# Patient Record
Sex: Female | Born: 1980 | Race: White | Hispanic: No | Marital: Single | Smoking: Current every day smoker
Health system: Southern US, Community
[De-identification: ages and names within clinical notes are randomized; demographics above are authoritative.]

## PROBLEM LIST (undated history)

## (undated) DIAGNOSIS — R7611 Nonspecific reaction to tuberculin skin test without active tuberculosis: Secondary | ICD-10-CM

## (undated) DIAGNOSIS — Z720 Tobacco use: Secondary | ICD-10-CM

## (undated) DIAGNOSIS — F3281 Premenstrual dysphoric disorder: Secondary | ICD-10-CM

## (undated) DIAGNOSIS — S37019A Minor contusion of unspecified kidney, initial encounter: Secondary | ICD-10-CM

## (undated) DIAGNOSIS — I4581 Long QT syndrome: Secondary | ICD-10-CM

## (undated) DIAGNOSIS — F1910 Other psychoactive substance abuse, uncomplicated: Secondary | ICD-10-CM

## (undated) DIAGNOSIS — S2239XA Fracture of one rib, unspecified side, initial encounter for closed fracture: Secondary | ICD-10-CM

## (undated) DIAGNOSIS — S2249XA Multiple fractures of ribs, unspecified side, initial encounter for closed fracture: Secondary | ICD-10-CM

---
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
## 2001-02-27 ENCOUNTER — Emergency Department (HOSPITAL_COMMUNITY): Admission: EM | Admit: 2001-02-27 | Discharge: 2001-02-27 | Payer: Self-pay | Admitting: Internal Medicine

## 2001-04-18 ENCOUNTER — Emergency Department (HOSPITAL_COMMUNITY): Admission: EM | Admit: 2001-04-18 | Discharge: 2001-04-19 | Payer: Self-pay | Admitting: Emergency Medicine

## 2001-12-20 ENCOUNTER — Emergency Department (HOSPITAL_COMMUNITY): Admission: EM | Admit: 2001-12-20 | Discharge: 2001-12-20 | Payer: Self-pay | Admitting: Emergency Medicine

## 2001-12-20 ENCOUNTER — Encounter: Payer: Self-pay | Admitting: Emergency Medicine

## 2002-02-21 ENCOUNTER — Emergency Department (HOSPITAL_COMMUNITY): Admission: EM | Admit: 2002-02-21 | Discharge: 2002-02-21 | Payer: Self-pay | Admitting: Emergency Medicine

## 2002-02-21 ENCOUNTER — Encounter: Payer: Self-pay | Admitting: Emergency Medicine

## 2002-12-05 ENCOUNTER — Emergency Department (HOSPITAL_COMMUNITY): Admission: EM | Admit: 2002-12-05 | Discharge: 2002-12-05 | Payer: Self-pay | Admitting: Emergency Medicine

## 2003-02-14 ENCOUNTER — Emergency Department (HOSPITAL_COMMUNITY): Admission: EM | Admit: 2003-02-14 | Discharge: 2003-02-14 | Payer: Self-pay | Admitting: *Deleted

## 2003-04-01 ENCOUNTER — Emergency Department (HOSPITAL_COMMUNITY): Admission: EM | Admit: 2003-04-01 | Discharge: 2003-04-01 | Payer: Self-pay | Admitting: Emergency Medicine

## 2003-04-01 ENCOUNTER — Encounter: Payer: Self-pay | Admitting: Emergency Medicine

## 2003-04-14 ENCOUNTER — Encounter: Payer: Self-pay | Admitting: Emergency Medicine

## 2003-04-14 ENCOUNTER — Emergency Department (HOSPITAL_COMMUNITY): Admission: EM | Admit: 2003-04-14 | Discharge: 2003-04-14 | Payer: Self-pay | Admitting: Emergency Medicine

## 2003-04-25 ENCOUNTER — Emergency Department (HOSPITAL_COMMUNITY): Admission: AD | Admit: 2003-04-25 | Discharge: 2003-04-25 | Payer: Self-pay | Admitting: Family Medicine

## 2003-08-27 ENCOUNTER — Emergency Department (HOSPITAL_COMMUNITY): Admission: EM | Admit: 2003-08-27 | Discharge: 2003-08-28 | Payer: Self-pay | Admitting: Emergency Medicine

## 2003-11-28 ENCOUNTER — Emergency Department (HOSPITAL_COMMUNITY): Admission: EM | Admit: 2003-11-28 | Discharge: 2003-11-28 | Payer: Self-pay | Admitting: Emergency Medicine

## 2003-11-30 ENCOUNTER — Emergency Department (HOSPITAL_COMMUNITY): Admission: EM | Admit: 2003-11-30 | Discharge: 2003-11-30 | Payer: Self-pay | Admitting: Family Medicine

## 2003-12-20 ENCOUNTER — Emergency Department (HOSPITAL_COMMUNITY): Admission: EM | Admit: 2003-12-20 | Discharge: 2003-12-20 | Payer: Self-pay | Admitting: Emergency Medicine

## 2003-12-27 ENCOUNTER — Emergency Department (HOSPITAL_COMMUNITY): Admission: EM | Admit: 2003-12-27 | Discharge: 2003-12-28 | Payer: Self-pay | Admitting: Emergency Medicine

## 2003-12-27 ENCOUNTER — Emergency Department (HOSPITAL_COMMUNITY): Admission: EM | Admit: 2003-12-27 | Discharge: 2003-12-27 | Payer: Self-pay | Admitting: Emergency Medicine

## 2004-02-11 ENCOUNTER — Emergency Department (HOSPITAL_COMMUNITY): Admission: EM | Admit: 2004-02-11 | Discharge: 2004-02-11 | Payer: Self-pay | Admitting: Emergency Medicine

## 2004-03-15 ENCOUNTER — Emergency Department (HOSPITAL_COMMUNITY): Admission: EM | Admit: 2004-03-15 | Discharge: 2004-03-15 | Payer: Self-pay | Admitting: Family Medicine

## 2004-04-14 ENCOUNTER — Emergency Department (HOSPITAL_COMMUNITY): Admission: EM | Admit: 2004-04-14 | Discharge: 2004-04-14 | Payer: Self-pay

## 2004-07-01 ENCOUNTER — Emergency Department (HOSPITAL_COMMUNITY): Admission: EM | Admit: 2004-07-01 | Discharge: 2004-07-01 | Payer: Self-pay | Admitting: Emergency Medicine

## 2004-08-14 ENCOUNTER — Emergency Department (HOSPITAL_COMMUNITY): Admission: EM | Admit: 2004-08-14 | Discharge: 2004-08-14 | Payer: Self-pay | Admitting: Family Medicine

## 2004-09-29 ENCOUNTER — Emergency Department (HOSPITAL_COMMUNITY): Admission: EM | Admit: 2004-09-29 | Discharge: 2004-09-29 | Payer: Self-pay | Admitting: Family Medicine

## 2004-10-07 ENCOUNTER — Emergency Department (HOSPITAL_COMMUNITY): Admission: EM | Admit: 2004-10-07 | Discharge: 2004-10-07 | Payer: Self-pay | Admitting: Family Medicine

## 2004-10-23 ENCOUNTER — Emergency Department (HOSPITAL_COMMUNITY): Admission: EM | Admit: 2004-10-23 | Discharge: 2004-10-23 | Payer: Self-pay | Admitting: Family Medicine

## 2004-10-23 ENCOUNTER — Emergency Department (HOSPITAL_COMMUNITY): Admission: EM | Admit: 2004-10-23 | Discharge: 2004-10-23 | Payer: Self-pay | Admitting: Emergency Medicine

## 2004-12-10 ENCOUNTER — Emergency Department (HOSPITAL_COMMUNITY): Admission: EM | Admit: 2004-12-10 | Discharge: 2004-12-10 | Payer: Self-pay | Admitting: Family Medicine

## 2004-12-16 ENCOUNTER — Emergency Department (HOSPITAL_COMMUNITY): Admission: EM | Admit: 2004-12-16 | Discharge: 2004-12-16 | Payer: Self-pay | Admitting: Emergency Medicine

## 2004-12-31 ENCOUNTER — Emergency Department (HOSPITAL_COMMUNITY): Admission: EM | Admit: 2004-12-31 | Discharge: 2004-12-31 | Payer: Self-pay | Admitting: Emergency Medicine

## 2005-01-28 ENCOUNTER — Emergency Department (HOSPITAL_COMMUNITY): Admission: EM | Admit: 2005-01-28 | Discharge: 2005-01-28 | Payer: Self-pay | Admitting: Family Medicine

## 2005-02-25 ENCOUNTER — Emergency Department (HOSPITAL_COMMUNITY): Admission: EM | Admit: 2005-02-25 | Discharge: 2005-02-25 | Payer: Self-pay | Admitting: Family Medicine

## 2005-02-26 ENCOUNTER — Emergency Department (HOSPITAL_COMMUNITY): Admission: EM | Admit: 2005-02-26 | Discharge: 2005-02-26 | Payer: Self-pay | Admitting: Family Medicine

## 2005-03-07 ENCOUNTER — Emergency Department (HOSPITAL_COMMUNITY): Admission: EM | Admit: 2005-03-07 | Discharge: 2005-03-07 | Payer: Self-pay | Admitting: Emergency Medicine

## 2005-03-20 ENCOUNTER — Emergency Department (HOSPITAL_COMMUNITY): Admission: EM | Admit: 2005-03-20 | Discharge: 2005-03-20 | Payer: Self-pay | Admitting: Emergency Medicine

## 2005-03-20 ENCOUNTER — Emergency Department (HOSPITAL_COMMUNITY): Admission: EM | Admit: 2005-03-20 | Discharge: 2005-03-20 | Payer: Self-pay | Admitting: Family Medicine

## 2005-05-07 ENCOUNTER — Emergency Department (HOSPITAL_COMMUNITY): Admission: EM | Admit: 2005-05-07 | Discharge: 2005-05-07 | Payer: Self-pay | Admitting: *Deleted

## 2006-02-17 ENCOUNTER — Emergency Department (HOSPITAL_COMMUNITY): Admission: EM | Admit: 2006-02-17 | Discharge: 2006-02-17 | Payer: Self-pay | Admitting: Emergency Medicine

## 2006-02-20 ENCOUNTER — Emergency Department (HOSPITAL_COMMUNITY): Admission: EM | Admit: 2006-02-20 | Discharge: 2006-02-20 | Payer: Self-pay | Admitting: Emergency Medicine

## 2006-04-07 ENCOUNTER — Emergency Department (HOSPITAL_COMMUNITY): Admission: EM | Admit: 2006-04-07 | Discharge: 2006-04-07 | Payer: Self-pay | Admitting: Emergency Medicine

## 2006-04-23 ENCOUNTER — Emergency Department (HOSPITAL_COMMUNITY): Admission: EM | Admit: 2006-04-23 | Discharge: 2006-04-23 | Payer: Self-pay | Admitting: Emergency Medicine

## 2006-04-24 ENCOUNTER — Emergency Department (HOSPITAL_COMMUNITY): Admission: EM | Admit: 2006-04-24 | Discharge: 2006-04-24 | Payer: Self-pay | Admitting: Emergency Medicine

## 2006-07-26 ENCOUNTER — Emergency Department (HOSPITAL_COMMUNITY): Admission: EM | Admit: 2006-07-26 | Discharge: 2006-07-26 | Payer: Self-pay | Admitting: Family Medicine

## 2006-09-15 ENCOUNTER — Emergency Department (HOSPITAL_COMMUNITY): Admission: EM | Admit: 2006-09-15 | Discharge: 2006-09-15 | Payer: Self-pay | Admitting: Emergency Medicine

## 2006-11-08 ENCOUNTER — Emergency Department (HOSPITAL_COMMUNITY): Admission: EM | Admit: 2006-11-08 | Discharge: 2006-11-09 | Payer: Self-pay | Admitting: Emergency Medicine

## 2006-11-09 ENCOUNTER — Ambulatory Visit: Payer: Self-pay | Admitting: Psychiatry

## 2006-11-09 ENCOUNTER — Inpatient Hospital Stay (HOSPITAL_COMMUNITY): Admission: AD | Admit: 2006-11-09 | Discharge: 2006-11-11 | Payer: Self-pay | Admitting: Psychiatry

## 2006-11-11 ENCOUNTER — Emergency Department (HOSPITAL_COMMUNITY): Admission: EM | Admit: 2006-11-11 | Discharge: 2006-11-11 | Payer: Self-pay | Admitting: Emergency Medicine

## 2008-02-13 ENCOUNTER — Emergency Department (HOSPITAL_COMMUNITY): Admission: EM | Admit: 2008-02-13 | Discharge: 2008-02-13 | Payer: Self-pay | Admitting: Family Medicine

## 2008-03-31 ENCOUNTER — Emergency Department (HOSPITAL_COMMUNITY): Admission: EM | Admit: 2008-03-31 | Discharge: 2008-03-31 | Payer: Self-pay | Admitting: Family Medicine

## 2008-04-16 ENCOUNTER — Emergency Department (HOSPITAL_COMMUNITY): Admission: EM | Admit: 2008-04-16 | Discharge: 2008-04-16 | Payer: Self-pay | Admitting: Family Medicine

## 2008-05-15 ENCOUNTER — Emergency Department (HOSPITAL_COMMUNITY): Admission: EM | Admit: 2008-05-15 | Discharge: 2008-05-15 | Payer: Self-pay | Admitting: Emergency Medicine

## 2009-04-02 ENCOUNTER — Emergency Department (HOSPITAL_COMMUNITY): Admission: EM | Admit: 2009-04-02 | Discharge: 2009-04-03 | Payer: Self-pay | Admitting: Emergency Medicine

## 2009-11-20 ENCOUNTER — Emergency Department (HOSPITAL_COMMUNITY): Admission: EM | Admit: 2009-11-20 | Discharge: 2009-11-21 | Payer: Self-pay | Admitting: Emergency Medicine

## 2010-07-18 DIAGNOSIS — R7611 Nonspecific reaction to tuberculin skin test without active tuberculosis: Secondary | ICD-10-CM

## 2010-07-18 HISTORY — DX: Nonspecific reaction to tuberculin skin test without active tuberculosis: R76.11

## 2010-08-08 ENCOUNTER — Emergency Department (HOSPITAL_COMMUNITY)
Admission: EM | Admit: 2010-08-08 | Discharge: 2010-08-08 | Payer: Self-pay | Source: Home / Self Care | Admitting: Emergency Medicine

## 2010-12-03 NOTE — Discharge Summary (Signed)
[name], [name]             ACCOUNT NO.:  [id number]   MEDICAL RECORD NO.:  [id number]          PATIENT TYPE:  IPS   LOCATION:  0501                          FACILITY:  [facility]   PHYSICIAN:  Geoffery Lyons, M.D.      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  Denies suicidal  thoughts.  Getting into withdrawal every time she tries to quit most  with aches sweats, nausea.   PAST PSYCHIATRIC HISTORY:  One of several admissions.  ADHD by history.  A history of prior opiate abuse, admitted to [facility] and  [facility].  A history of suicide attempts.   SECONDARY HISTORY:  As already stated, abuse of opioids.   MEDICAL HISTORY:  Noncontributory.   MEDICATIONS:  None prescribed.   Physical exam performed failed to show any acute findings.   LABORATORY WORK:  CBC:  White blood cells 5.6, hemoglobin 13.3, sodium  132, potassium 3.3, glucose 93, BUN 3, creatinine 0.57, SGOT 20, SGPT  15, TSH 1.375.   Mental status exam revealed a female that initially is sleeping,  arousable, alert,  cooperative.  Speech nonspontaneous.  Minimal  production.  Mood anxious, depressed.  Affect constricted.  Thought  process logical, coherent, and relevant.  No evidence of delusions.  No  hallucinations.  Cognition well-preserved.   DIAGNOSES:  AXIS I:  Opiate dependence.  AXIS II:  No diagnosis.  AXIS III:  No diagnosis.  AXIS IV:  Moderate.  AXIS V:  Upon admission 36; highest global assessment of functioning in  the last year of 70.   COURSE IN THE HOSPITAL:  She was admitted, started having individual and  group psychotherapy.  She  was detoxified with clonidine.  As already  stated, a 30 year old female, single, a student at [employer], endorsed being  dependent on opiates, Vicodin, Percocet, heroin-used to use it IV,  now  snorts.  Went into detox.  Committed to abstinence.  Will graduate next  spring.  In a relationship with her boyfriend who is supportive.  Endorsing malaise, body aches, GI distress.  In regards to that, she was  prescribed for pain day, then they realized they would give her the  energy that she needed so she started abusing it.  On April 25, she  endorsed she was much better.  Able to keep fluids.  Has been able to  eat.  Overall felt that she was  going to be able to make it.  There was  no other acute withdrawal.  She was going to finish the detox and  consider going home the following day, April 26.  She was in full  contact with reality.  Endorsed that the withdrawal was under control.  Mood was improved.  Affect  was brighter.  No suicidal or homicidal  ideas.  No hallucinations.  No delusions.  Endorsed that she was doing  well.  Was going to follow up at the [facility].   DISCHARGE DIAGNOSES:  AXIS I:  Opiate dependence and status post opiate  withdrawal.  AXIS II:  No diagnosis.  AXIS III:  No diagnosis.  AXIS IV:  Moderate.  AXIS V:  Upon discharge, 50.   Discharged on no medications.  To follow up with [facility] at the  [facility].      Geoffery Lyons, M.D.  Electronically Signed     IL/MEDQ  D:  12/07/2006  T:  12/08/2006  Job:  [id number]

## 2011-04-01 ENCOUNTER — Inpatient Hospital Stay (INDEPENDENT_AMBULATORY_CARE_PROVIDER_SITE_OTHER)
Admission: RE | Admit: 2011-04-01 | Discharge: 2011-04-01 | Disposition: A | Discharge status: Home / Self Care | Payer: Self-pay | Source: Ambulatory Visit | Attending: Emergency Medicine | Admitting: Emergency Medicine

## 2011-04-01 DIAGNOSIS — J069 Acute upper respiratory infection, unspecified: Secondary | ICD-10-CM

## 2011-12-08 ENCOUNTER — Emergency Department (INDEPENDENT_AMBULATORY_CARE_PROVIDER_SITE_OTHER)
Admission: EM | Admit: 2011-12-08 | Discharge: 2011-12-08 | Disposition: A | Discharge status: Home / Self Care | Payer: Self-pay | Source: Home / Self Care | Attending: Emergency Medicine | Admitting: Emergency Medicine

## 2011-12-08 ENCOUNTER — Encounter (HOSPITAL_COMMUNITY): Payer: Self-pay | Admitting: Emergency Medicine

## 2011-12-08 DIAGNOSIS — J209 Acute bronchitis, unspecified: Secondary | ICD-10-CM

## 2011-12-08 DIAGNOSIS — J019 Acute sinusitis, unspecified: Secondary | ICD-10-CM

## 2011-12-08 DIAGNOSIS — J069 Acute upper respiratory infection, unspecified: Secondary | ICD-10-CM

## 2011-12-08 HISTORY — DX: Nonspecific reaction to tuberculin skin test without active tuberculosis: R76.11

## 2011-12-08 MED ORDER — BENZONATATE 200 MG PO CAPS: 200.0000 mg | ORAL_CAPSULE | Freq: Three times a day (TID) | ORAL | Status: AC | PRN

## 2011-12-08 MED ORDER — AMOXICILLIN 500 MG PO CAPS: 1000.0000 mg | ORAL_CAPSULE | Freq: Three times a day (TID) | ORAL | Status: AC

## 2011-12-08 NOTE — Discharge Instructions (Signed)

## 2011-12-08 NOTE — ED Notes (Signed)
Onset of symptoms one week ago.  Symptoms include an sore throat initially, then body aches, chest and back pressure, sinus congestion, cough that worsens at night and fatigue and weakness.  Patient has noticed specks of blood on tissue when blowing nose this am.

## 2011-12-08 NOTE — ED Notes (Signed)
[name] CALLED DUE TO MISSING ELECTRONIC RX SENT BY DR.[email or web address] RX WAS RECEIVED.CALLED IN THE OTHER RX [place]ALON

## 2011-12-08 NOTE — ED Provider Notes (Signed)
Chief Complaint  Patient presents with  . URI    History of Present Illness:   [name] is a 31 year old female who's had a one-week history of sore throat, postnasal drip, and hoarseness. She feels achy all over, tired, weak, hot, and cold. She's had a cough productive of green sputum and some chest tightness. 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 She smokes a pack of cigarettes per day, down from 2 packs several months ago. She has some nasal congestion with yellow rhinorrhea and today had an episode of epistaxis. She's also had a headache.  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 Her chest x-ray was normal. She was given INH by the health Department, but has not started it yet. She plans to followup with the health department for her INH followup.  Review of Systems:  Other than noted above, the patient denies any of the following symptoms. Systemic:  No fever, chills, sweats, fatigue, myalgias, headache, or anorexia. Eye:  No redness, pain or drainage. ENT:  No earache, ear congestion, nasal congestion, sneezing, rhinorrhea, sinus pressure, sinus pain, post nasal drip, or sore throat. Lungs:  No cough, sputum production, wheezing, shortness of breath, or chest pain. GI:  No abdominal pain, nausea, vomiting, or diarrhea. Skin:  No rash or itching.  PMFSH:  Past medical history, family history, social history, meds, and allergies were reviewed.  Physical Exam:   Vital signs:  BP 97/63  Pulse 89  Temp(Src) 97.8 F (36.6 C) (Oral)  Resp 18  SpO2 96%  LMP 12/08/2011 General:  Alert, in no distress. Eye:  No conjunctival injection or drainage. Lids were normal. ENT:  TMs and canals were normal, without erythema or inflammation.  Nasal mucosa was clear and uncongested, without drainage.  Mucous membranes were moist.  Pharynx was clear, without exudate or drainage.  There were no oral ulcerations or lesions. Neck:  Supple, no adenopathy, tenderness or  mass. Lungs:  No respiratory distress.  Lungs were clear to auscultation, without wheezes, rales or rhonchi.  Breath sounds were clear and equal bilaterally. Lungs were resonant to percussion.  No egophony. Heart:  Regular rhythm, without gallops, murmers or rubs. Skin:  Clear, warm, and dry, without rash or lesions.  Assessment:  The primary encounter diagnosis was Viral upper respiratory infection. Diagnoses of Acute sinusitis and Acute bronchitis were also pertinent to this visit.  Plan:   1.  The following meds were prescribed:   New Prescriptions   AMOXICILLIN (AMOXIL) 500 MG CAPSULE    Take 2 capsules (1,000 mg total) by mouth 3 (three) times daily.   BENZONATATE (TESSALON) 200 MG CAPSULE    Take 1 capsule (200 mg total) by mouth 3 (three) times daily as needed for cough.   2.  The patient was instructed in symptomatic care and handouts were given. 3.  The patient was told to return if becoming worse in any way, if no better in 3 or 4 days, and given some red flag symptoms that would indicate earlier return. She was encouraged to quit smoking and to take her INH and followup at the health department.   Reuben Likes, MD 12/08/11 1215

## 2011-12-08 NOTE — ED Notes (Signed)
No primary care provider.

## 2012-12-30 ENCOUNTER — Emergency Department (HOSPITAL_COMMUNITY)
Admission: EM | Admit: 2012-12-30 | Discharge: 2012-12-30 | Discharge status: Against Medical Advice | Payer: Self-pay | Attending: Emergency Medicine | Admitting: Emergency Medicine

## 2012-12-30 ENCOUNTER — Emergency Department (HOSPITAL_COMMUNITY): Payer: Self-pay

## 2012-12-30 ENCOUNTER — Encounter (HOSPITAL_COMMUNITY): Payer: Self-pay | Admitting: Emergency Medicine

## 2012-12-30 DIAGNOSIS — R079 Chest pain, unspecified: Secondary | ICD-10-CM

## 2012-12-30 DIAGNOSIS — Z3202 Encounter for pregnancy test, result negative: Secondary | ICD-10-CM | POA: Insufficient documentation

## 2012-12-30 DIAGNOSIS — F17200 Nicotine dependence, unspecified, uncomplicated: Secondary | ICD-10-CM | POA: Insufficient documentation

## 2012-12-30 DIAGNOSIS — R0789 Other chest pain: Secondary | ICD-10-CM | POA: Insufficient documentation

## 2012-12-30 DIAGNOSIS — Z79899 Other long term (current) drug therapy: Secondary | ICD-10-CM | POA: Insufficient documentation

## 2012-12-30 LAB — URINALYSIS W MICROSCOPIC + REFLEX CULTURE
Glucose, UA: NEGATIVE mg/dL
Nitrite: NEGATIVE
Protein, ur: NEGATIVE mg/dL
Specific Gravity, Urine: 1.015 (ref 1.005–1.030)
Urobilinogen, UA: 0.2 mg/dL (ref 0.0–1.0)

## 2012-12-30 LAB — CBC WITH DIFFERENTIAL/PLATELET
Basophils Absolute: 0 10*3/uL (ref 0.0–0.1)
Basophils Relative: 0 % (ref 0–1)
Eosinophils Relative: 1 % (ref 0–5)
HCT: 41.2 % (ref 36.0–46.0)
Lymphocytes Relative: 33 % (ref 12–46)
Lymphs Abs: 2.6 10*3/uL (ref 0.7–4.0)
MCHC: 35.4 g/dL (ref 30.0–36.0)
Monocytes Absolute: 0.7 10*3/uL (ref 0.1–1.0)
Neutro Abs: 4.6 10*3/uL (ref 1.7–7.7)
Neutrophils Relative %: 57 % (ref 43–77)

## 2012-12-30 LAB — COMPREHENSIVE METABOLIC PANEL
ALT: 11 U/L (ref 0–35)
AST: 21 U/L (ref 0–37)
Albumin: 3.9 g/dL (ref 3.5–5.2)
Alkaline Phosphatase: 56 U/L (ref 39–117)
BUN: 8 mg/dL (ref 6–23)
CO2: 27 mEq/L (ref 19–32)
Calcium: 8.7 mg/dL (ref 8.4–10.5)
Creatinine, Ser: 0.65 mg/dL (ref 0.50–1.10)
GFR calc Af Amer: >90 mL/min (ref >90)
Potassium: 4.1 mEq/L (ref 3.5–5.1)
Total Protein: 7.2 g/dL (ref 6.0–8.3)

## 2012-12-30 LAB — RAPID URINE DRUG SCREEN, HOSP PERFORMED: Cocaine: NOT DETECTED

## 2012-12-30 LAB — POCT PREGNANCY, URINE: Preg Test, Ur: NEGATIVE

## 2012-12-30 LAB — POCT I-STAT TROPONIN I: Troponin i, poc: 0.03 ng/mL (ref 0.00–0.08)

## 2012-12-30 NOTE — ED Provider Notes (Signed)
History     CSN: [id number]  Arrival date & time 12/30/12  1209   First MD Initiated Contact with Patient 12/30/12 1231      Chief Complaint  Patient presents with  . Chest Pain    (Consider location/radiation/quality/duration/timing/severity/associated sxs/prior treatment) HPI [name] is a 32 y.o. female who present to ed with chest pain. Stats pain in retrosternal. For 3 wks. Comes and goes. Lasting about an hour at a time. States at times radiating into left arm and left neck. States it is excertional. No pleuritic componint. No cough or URI symptoms. No shortness of breath, nausea, dizziness, diaphoresis. Denies prior cardiac hx. No family hx of heart problems. Denies recent travel or surgeries.   Past Medical History  Diagnosis Date  . 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      History reviewed. No pertinent past surgical history.  No family history on file.  History  Substance Use Topics  . Smoking status: Current Every Day Smoker  . Smokeless tobacco: Not on file  . Alcohol Use: No    OB History   Grav Para Term Preterm Abortions TAB SAB Ect Mult Living                  Review of Systems  Respiratory: Positive for chest tightness. Negative for cough and shortness of breath.   Cardiovascular: Positive for chest pain. Negative for palpitations and leg swelling.  All other systems reviewed and are negative.    Allergies  Sulfa antibiotics  Home Medications   Current Outpatient Rx  Name  Route  Sig  Dispense  Refill  . METHADONE HCL PO   Oral   Take 79 mg by mouth daily.           BP 120/69  Pulse 97  Temp(Src) 98.2 F (36.8 C) (Oral)  Resp 18  Wt 118 lb 8 oz (53.751 kg)  SpO2 98%  LMP 12/16/2012  Physical Exam  Nursing note and vitals reviewed. Constitutional: She is oriented to person, place, and time. She appears well-developed and well-nourished. No distress.  Eyes: Conjunctivae are  normal.  Neck: Neck supple.  Cardiovascular: Normal rate, regular rhythm and normal heart sounds.   Pulmonary/Chest: Effort normal and breath sounds normal. No respiratory distress. She has no wheezes. She has no rales.  Musculoskeletal: She exhibits no edema.  Neurological: She is alert and oriented to person, place, and time.  Skin: Skin is warm and dry.    ED Course  Procedures (including critical care time)  Results for orders placed during the hospital encounter of 12/30/12  CBC WITH DIFFERENTIAL      Result Value Range   WBC 8.0  4.0 - 10.5 K/uL   RBC 4.67  3.87 - 5.11 MIL/uL   Hemoglobin 14.6  12.0 - 15.0 g/dL   HCT 13.0  86.5 - 78.4 %   MCV 88.2  78.0 - 100.0 fL   MCH 31.3  26.0 - 34.0 pg   MCHC 35.4  30.0 - 36.0 g/dL   RDW 69.6  29.5 - 28.4 %   Platelets 217  150 - 400 K/uL   Neutrophils Relative % 57  43 - 77 %   Neutro Abs 4.6  1.7 - 7.7 K/uL   Lymphocytes Relative 33  12 - 46 %   Lymphs Abs 2.6  0.7 - 4.0 K/uL   Monocytes Relative 9  3 - 12 %  Monocytes Absolute 0.7  0.1 - 1.0 K/uL   Eosinophils Relative 1  0 - 5 %   Eosinophils Absolute 0.1  0.0 - 0.7 K/uL   Basophils Relative 0  0 - 1 %   Basophils Absolute 0.0  0.0 - 0.1 K/uL  COMPREHENSIVE METABOLIC PANEL      Result Value Range   Sodium 136  135 - 145 mEq/L   Potassium 4.1  3.5 - 5.1 mEq/L   Chloride 102  96 - 112 mEq/L   CO2 27  19 - 32 mEq/L   Glucose, Bld 86  70 - 99 mg/dL   BUN 8  6 - 23 mg/dL   Creatinine, Ser 1.61  0.50 - 1.10 mg/dL   Calcium 8.7  8.4 - 09.6 mg/dL   Total Protein 7.2  6.0 - 8.3 g/dL   Albumin 3.9  3.5 - 5.2 g/dL   AST 21  0 - 37 U/L   ALT 11  0 - 35 U/L   Alkaline Phosphatase 56  39 - 117 U/L   Total Bilirubin 0.3  0.3 - 1.2 mg/dL   GFR calc non Af Amer >90  >90 mL/min   GFR calc Af Amer >90  >90 mL/min  URINE RAPID DRUG SCREEN (HOSP PERFORMED)      Result Value Range   Opiates POSITIVE (*) NONE DETECTED   Cocaine NONE DETECTED  NONE DETECTED   Benzodiazepines NONE  DETECTED  NONE DETECTED   Amphetamines NONE DETECTED  NONE DETECTED   Tetrahydrocannabinol NONE DETECTED  NONE DETECTED   Barbiturates NONE DETECTED  NONE DETECTED  URINALYSIS W MICROSCOPIC + REFLEX CULTURE      Result Value Range   Color, Urine YELLOW  YELLOW   APPearance CLEAR  CLEAR   Specific Gravity, Urine 1.015  1.005 - 1.030   pH 5.5  5.0 - 8.0   Glucose, UA NEGATIVE  NEGATIVE mg/dL   Hgb urine dipstick NEGATIVE  NEGATIVE   Bilirubin Urine NEGATIVE  NEGATIVE   Ketones, ur NEGATIVE  NEGATIVE mg/dL   Protein, ur NEGATIVE  NEGATIVE mg/dL   Urobilinogen, UA 0.2  0.0 - 1.0 mg/dL   Nitrite NEGATIVE  NEGATIVE   Leukocytes, UA NEGATIVE  NEGATIVE   WBC, UA 0-2  <3 WBC/hpf   RBC / HPF 0-2  <3 RBC/hpf   Bacteria, UA RARE  RARE   Squamous Epithelial / LPF RARE  RARE  POCT PREGNANCY, URINE      Result Value Range   Preg Test, Ur NEGATIVE  NEGATIVE  POCT I-STAT TROPONIN I      Result Value Range   Troponin i, poc 0.03  0.00 - 0.08 ng/mL   Comment 3            Dg Chest 2 View  12/30/2012   *RADIOLOGY REPORT*  Clinical Data: Chest pain  CHEST - 2 VIEW  Comparison: 08/08/2010  Findings: Cardiomediastinal silhouette is stable.  No acute infiltrate or pleural effusion.  No pulmonary edema.  Bony thorax is stable.  IMPRESSION: No active disease.  No significant change.   Original Report Authenticated By: Natasha Mead, M.D.    Date: 12/30/2012  Rate: 88  Rhythm: normal sinus rhythm  QRS Axis: normal  Intervals: normal  ST/T Wave abnormalities: nonspecific T wave changes  Conduction Disutrbances:none  Narrative Interpretation:   Old EKG Reviewed: unchanged when compared to 12/16/04     1. Chest pain       MDM  Pt with intermittent  chest pain for several weeks now. She has no risk factors for ACS, although story she gives is concerning. Pt is PERC negative doubt PE. ECG unchanged from 2006. First set of enzymes negative. Explained that wanted to monitor pt for 3 hrs and recheck 2nd  troponin. Pt did not want to wait. She signed out AMA. Given referral for cardiology. Pt will follow up.   Filed Vitals:   12/30/12 1357 12/30/12 1400 12/30/12 1419 12/30/12 1500  BP:  104/67 104/67 120/69  Pulse: 72 78 68 97  Temp:   98.2 F (36.8 C)   TempSrc:   Oral   Resp: 23 10 14 18   Weight:      SpO2: 100% 99% 97% 98%           Lottie Mussel, PA-C 12/30/12 1655

## 2012-12-30 NOTE — ED Notes (Signed)
PA at bedside.

## 2012-12-30 NOTE — ED Notes (Signed)
Pt. Stated, I'VE BEEN HAVING SOME cp FOR A MONTH THAT'S INTERMITTENT.  i also have some SOB .

## 2012-12-30 NOTE — ED Provider Notes (Signed)
Medical screening examination/treatment/procedure(s) were performed by non-physician practitioner and as supervising physician I was immediately available for consultation/collaboration.   Gwyneth Sprout, MD 12/30/12 2043

## 2013-01-27 ENCOUNTER — Encounter (HOSPITAL_COMMUNITY): Payer: Self-pay | Admitting: *Deleted

## 2013-01-27 ENCOUNTER — Emergency Department (HOSPITAL_COMMUNITY)
Admission: EM | Admit: 2013-01-27 | Discharge: 2013-01-28 | Disposition: A | Discharge status: Home / Self Care | Payer: Self-pay | Attending: Emergency Medicine | Admitting: Emergency Medicine

## 2013-01-27 DIAGNOSIS — F17200 Nicotine dependence, unspecified, uncomplicated: Secondary | ICD-10-CM | POA: Insufficient documentation

## 2013-01-27 DIAGNOSIS — R0602 Shortness of breath: Secondary | ICD-10-CM | POA: Insufficient documentation

## 2013-01-27 DIAGNOSIS — I499 Cardiac arrhythmia, unspecified: Secondary | ICD-10-CM | POA: Insufficient documentation

## 2013-01-27 DIAGNOSIS — R000 Tachycardia, unspecified: Secondary | ICD-10-CM | POA: Insufficient documentation

## 2013-01-27 DIAGNOSIS — Z79899 Other long term (current) drug therapy: Secondary | ICD-10-CM | POA: Insufficient documentation

## 2013-01-27 DIAGNOSIS — R002 Palpitations: Secondary | ICD-10-CM | POA: Insufficient documentation

## 2013-01-27 DIAGNOSIS — R9431 Abnormal electrocardiogram [ECG] [EKG]: Secondary | ICD-10-CM | POA: Insufficient documentation

## 2013-01-27 DIAGNOSIS — F1120 Opioid dependence, uncomplicated: Secondary | ICD-10-CM | POA: Insufficient documentation

## 2013-01-27 DIAGNOSIS — R079 Chest pain, unspecified: Secondary | ICD-10-CM | POA: Insufficient documentation

## 2013-01-27 HISTORY — DX: Other psychoactive substance abuse, uncomplicated: F19.10

## 2013-01-27 HISTORY — DX: Tobacco use: Z72.0

## 2013-01-27 LAB — POCT I-STAT TROPONIN I

## 2013-01-27 NOTE — ED Notes (Addendum)
Pt states she has had the sensation of palpations before and tachycardia. Pt states when it happened before at her dentist she was given lidocaine and the pain went away Pt states that she woke up with CP, pain is center chest and feels like pressure. Pt states that earlier the pain increased with deep breathing but that now it is constant.

## 2013-01-28 ENCOUNTER — Emergency Department (HOSPITAL_COMMUNITY): Payer: Self-pay

## 2013-01-28 ENCOUNTER — Other Ambulatory Visit: Payer: Self-pay

## 2013-01-28 ENCOUNTER — Encounter (HOSPITAL_COMMUNITY): Payer: Self-pay | Admitting: Radiology

## 2013-01-28 DIAGNOSIS — R079 Chest pain, unspecified: Secondary | ICD-10-CM

## 2013-01-28 DIAGNOSIS — R9431 Abnormal electrocardiogram [ECG] [EKG]: Secondary | ICD-10-CM

## 2013-01-28 DIAGNOSIS — R002 Palpitations: Secondary | ICD-10-CM

## 2013-01-28 LAB — CBC WITH DIFFERENTIAL/PLATELET
Basophils Absolute: 0 10*3/uL (ref 0.0–0.1)
Basophils Relative: 0 % (ref 0–1)
Hemoglobin: 15.2 g/dL — ABNORMAL HIGH (ref 12.0–15.0)
Lymphs Abs: 1 10*3/uL (ref 0.7–4.0)
MCHC: 35.4 g/dL (ref 30.0–36.0)
MCV: 88.3 fL (ref 78.0–100.0)
Neutrophils Relative %: 80 % — ABNORMAL HIGH (ref 43–77)
Platelets: 221 10*3/uL (ref 150–400)
RDW: 11.8 % (ref 11.5–15.5)
WBC: 11.2 10*3/uL — ABNORMAL HIGH (ref 4.0–10.5)

## 2013-01-28 LAB — RAPID URINE DRUG SCREEN, HOSP PERFORMED
Cocaine: NOT DETECTED
Opiates: POSITIVE — AB
Tetrahydrocannabinol: NOT DETECTED

## 2013-01-28 LAB — COMPREHENSIVE METABOLIC PANEL
ALT: 12 U/L (ref 0–35)
AST: 24 U/L (ref 0–37)
Alkaline Phosphatase: 64 U/L (ref 39–117)
CO2: 27 mEq/L (ref 19–32)
Calcium: 9.2 mg/dL (ref 8.4–10.5)
Glucose, Bld: 130 mg/dL — ABNORMAL HIGH (ref 70–99)
Total Bilirubin: 0.5 mg/dL (ref 0.3–1.2)
Total Protein: 7.4 g/dL (ref 6.0–8.3)

## 2013-01-28 LAB — D-DIMER, QUANTITATIVE: D-Dimer, Quant: 5.13 ug/mL-FEU — ABNORMAL HIGH (ref 0.00–0.48)

## 2013-01-28 LAB — MAGNESIUM: Magnesium: 1.6 mg/dL (ref 1.5–2.5)

## 2013-01-28 MED ORDER — METHADONE HCL 10 MG PO TABS
80.0000 mg | ORAL_TABLET | ORAL | Status: AC
  Administered 2013-01-28: 80 mg via ORAL

## 2013-01-28 MED ORDER — NICOTINE 21 MG/24HR TD PT24
21.0000 mg | MEDICATED_PATCH | Freq: Once | TRANSDERMAL | Status: DC
  Administered 2013-01-28: 21 mg via TRANSDERMAL
  Filled 2013-01-28: qty 1

## 2013-01-28 MED ORDER — ATENOLOL 25 MG PO TABS: 25.0000 mg | ORAL_TABLET | Freq: Every day | ORAL | Status: DC

## 2013-01-28 MED ORDER — IOHEXOL 350 MG/ML SOLN
100.0000 mL | Freq: Once | INTRAVENOUS | Status: AC | PRN
  Administered 2013-01-28: 100 mL via INTRAVENOUS

## 2013-01-28 NOTE — ED Notes (Signed)
CARDIOLOGY PA IN TO SEE PT. SHE IS WAITING ON DR ROSS TO COME DOWN AND EVAL PT

## 2013-01-28 NOTE — ED Provider Notes (Signed)
History    CSN: [id number] Arrival date & time 01/27/13  2245  First MD Initiated Contact with Patient 01/28/13 0035     Chief Complaint  Patient presents with  . Chest Pain  . Irregular Heart Beat   (Consider location/radiation/quality/duration/timing/severity/associated sxs/prior Treatment) HPI Patient is a generally healthy 32 yo woman who presents after experiencing centrally located chest pain associated with tachycardia (pulse 120s per EMS).  Patient awoke around 2200 tonight with sx and called 911.   She says she has felt SOB with CP. She has been having this intermittent chest pain for the past month or so.  Pt describes pain as pressure like - moderate and severe.  Past Medical History  Diagnosis Date  . 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     History reviewed. No pertinent past surgical history. History reviewed. No pertinent family history. History  Substance Use Topics  . Smoking status: Current Every Day Smoker  . Smokeless tobacco: Not on file  . Alcohol Use: No   OB History   Grav Para Term Preterm Abortions TAB SAB Ect Mult Living                 Review of Systems Gen: no weight loss, fevers, chills, night sweats Eyes: no discharge or drainage, no occular pain or visual changes Nose: no epistaxis or rhinorrhea Mouth: no dental pain, no sore throat Neck: no neck pain Lungs: As per history of present illness, otherwise negative CV: As per history of present illness, otherwise negative Abd: no abdominal pain, nausea, vomiting GU: no dysuria or gross hematuria MSK: no myalgias or arthralgias Neuro: no headache, no focal neurologic deficits Skin: no rash Psyche: negative.  Allergies  Sulfa antibiotics  Home Medications   Current Outpatient Rx  Name  Route  Sig  Dispense  Refill  . METHADONE HCL PO   Oral   Take 79 mg by mouth daily.          BP 108/61  Pulse 118  Resp 16  SpO2 97%  LMP  12/16/2012 Physical Exam Gen: well developed and well nourished appearing, mildly anxious appearing Head: NCAT Eyes: PERL, EOMI Nose: no epistaixis or rhinorrhea Mouth/throat: mucosa is moist and pink Neck: supple, no stridor Lungs: CTA B, no wheezing, rhonchi or rales Heart regular rate and rhythm, pulse 100, extremities well perfused Abd: soft, notender, nondistended Back: no ttp, no cva ttp Skin: no rashese, wnl Neuro: CN ii-xii grossly intact, no focal deficits Psyche; mildly anxious affect,  calm and cooperative.   ED Course  Procedures (including critical care time) Results for orders placed during the hospital encounter of 01/27/13 (from the past 24 hour(s))  CBC WITH DIFFERENTIAL     Status: Abnormal   Collection Time    01/27/13 10:56 PM      Result Value Range   WBC 11.2 (*) 4.0 - 10.5 K/uL   RBC 4.86  3.87 - 5.11 MIL/uL   Hemoglobin 15.2 (*) 12.0 - 15.0 g/dL   HCT 40.9  81.1 - 91.4 %   MCV 88.3  78.0 - 100.0 fL   MCH 31.3  26.0 - 34.0 pg   MCHC 35.4  30.0 - 36.0 g/dL   RDW 78.2  95.6 - 21.3 %   Platelets 221  150 - 400 K/uL   Neutrophils Relative % 80 (*) 43 - 77 %   Neutro Abs 9.0 (*) 1.7 - 7.7 K/uL  Lymphocytes Relative 9 (*) 12 - 46 %   Lymphs Abs 1.0  0.7 - 4.0 K/uL   Monocytes Relative 11  3 - 12 %   Monocytes Absolute 1.2 (*) 0.1 - 1.0 K/uL   Eosinophils Relative 1  0 - 5 %   Eosinophils Absolute 0.1  0.0 - 0.7 K/uL   Basophils Relative 0  0 - 1 %   Basophils Absolute 0.0  0.0 - 0.1 K/uL  COMPREHENSIVE METABOLIC PANEL     Status: Abnormal   Collection Time    01/27/13 10:56 PM      Result Value Range   Sodium 135  135 - 145 mEq/L   Potassium 4.0  3.5 - 5.1 mEq/L   Chloride 99  96 - 112 mEq/L   CO2 27  19 - 32 mEq/L   Glucose, Bld 130 (*) 70 - 99 mg/dL   BUN 8  6 - 23 mg/dL   Creatinine, Ser 1.61  0.50 - 1.10 mg/dL   Calcium 9.2  8.4 - 09.6 mg/dL   Total Protein 7.4  6.0 - 8.3 g/dL   Albumin 3.8  3.5 - 5.2 g/dL   AST 24  0 - 37 U/L   ALT 12  0  - 35 U/L   Alkaline Phosphatase 64  39 - 117 U/L   Total Bilirubin 0.5  0.3 - 1.2 mg/dL   GFR calc non Af Amer >90  >90 mL/min   GFR calc Af Amer >90  >90 mL/min  POCT I-STAT TROPONIN I     Status: None   Collection Time    01/27/13 11:46 PM      Result Value Range   Troponin i, poc 0.02  0.00 - 0.08 ng/mL   Comment 3            CXR: WNL  EKG: sinus tach, 118 bpm, normal axis, no acute st t wave changes, no signs of acute ischemia. Prolonged QT interval noted which is new compared to EKG from < 1 month ago.   MDM  Case discussed with Dr. Donnie Aho at approx 0430. He recommends EP consult at 0730 with TTE.  Case discussed with Dr. Betti Cruz and observation admission requested to complete above work up and monitoring for prolonged Qt in the setting of near syncope. Dr. Betti Cruz declines to admit the patient because he believes it will be most expeditious and cost effective to complete her workup and consultation in the ED.    TTE has been completed. I have made Dr. Ladona Ridgel, EP Cards for [facility], aware of the patient via his OR tech and spoken directly with his PA, Amber whose cell phone number is [phone].  She and Dr. Ladona Ridgel will consult in the ED and give recommendations re: tx and disposition.   Case signed out to Dr. Freida Busman at the change of shift, [phone].   Brandt Loosen, MD 01/28/13 [phone]

## 2013-01-28 NOTE — Progress Notes (Addendum)
Discharge Information for [name] ED Visit Date 01/28/13 Cardiologist: Dr. Lewayne Bunting (Given to patient upon discharge from ER):  - no strenuous activity for now (beyond walking) - avoid QTc-prolonging medications - if you have to take any medication at all, please discuss with a pharmacist or your doctor before taking - call your prescribing doctor of methadone today to discuss a plan to wean off this medication due to risk of QTc prolongation - avoid potassium-lowering medications - start atenolol 25mg  by mouth daily (prescribed electronically) - our office will call you to schedule an exercise stress test and followup appointment - please call the office if you have not heard from [place] within 3 days   [facility] [address] [place], [place] [address] [phone]

## 2013-01-28 NOTE — Consult Note (Signed)
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 Last night after coming inside from taking her dog for a walk, she sat down on the bed and noticed her heart was racing. It felt like it was pounding and was associated with a sense of chest pressure like something was sitting on her chest. The symptoms concerned her so she came right to the ER where she was found to be in sinus tach 117bpm with abnormally long QTc. Symptoms resolved around midnight on their own and she is currently in NSR in the 80's. F/u EKG showed NSR with normal QTc. She has had 2 other instances of tachypalpitations - the first episode occurred 2 months ago spontaneously and another episode happened after getting an injection of epi/lido at the dentist. She also reports frequent chest pain over the past 2 months. It occurs several times a week, sometimes all day long and unremitting. This is described as a pressure. It is sometimes asociated with SOB but no nausea, vomiting, or diaphoresis. It is not necessarily brought on by exertion, but when she is already experiencing it, it tends to worsen with exertion. (I.e. Mopping made it worse the other day when she had already had it for a while. But she was able [place] the [place]park just [place] few days ago and participate in strenous activity without any exertional symptoms.) She does endorse a sensation of food getting stuck at times. She also has had rare episodes of presyncope but not particularly related to any of the above symptoms. She denies any frank syncope. She denies  any recent travel, bedrest, surgery, or LEE. In the ED, troponin neg x 1. WBC 11.2, lytes WNL except glu 130. Urine hcg negative. UDS + opiates, negative otherwise. D-dimer is 5.13. CT angio negative for PE. 2D Echo today showed EF 55-60%, no WMA, no significant valvular disease.  Past Medical History  Diagnosis Date  . 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 ? Needs INH.  Marland Kitchen Polysubstance abuse     a. 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  . Tobacco abuse      Most Recent Cardiac Studies: 2D Echo 01/28/13 Left ventricle: The cavity size was normal. Systolic function was normal. The estimated ejection fraction was in the range of 55% to 60%. Wall motion was normal; there were no regional wall motion abnormalities.    Surgical History: History reviewed. No pertinent past surgical history.   Home Meds: Prior to Admission medications   Medication Sig Start Date End Date Taking? Authorizing Provider  METHADONE HCL PO Take 79 mg by mouth daily.   Yes Historical Provider, MD    Allergies:  Allergies  Allergen Reactions  . Sulfa Antibiotics Nausea Only    History   Social History  . Marital Status: Single    Spouse Name: N/A    Number of Children: N/A  . Years of Education: N/A   Occupational History  . Not on file.   Social History Main Topics  . Smoking status: Current  Every Day Smoker  . Smokeless tobacco: Not on file     Comment: Smoker since age 59  . Alcohol Use: No  . Drug Use: No     Comment: Remote history of opiates, heroin  . Sexually Active: Not on file   Other Topics Concern  . Not on file   Social History Narrative  . No narrative on file     Family History  Problem Relation Age of Onset  . CAD      Grandmother's father  . Other      Mother was worked up remotely for ?parasympathetic nervous system problem but cardiac workup was reportedly unremarkable    Review of  Systems: General: negative for chills, fever, night sweats Cardiovascular: negative for edema, orthopnea, palpitations, paroxysmal nocturnal dyspnea Dermatological: negative for rash Respiratory: negative for cough or wheezing Urologic: negative for hematuria Abdominal: negative for nausea, vomiting, diarrhea, bright red blood per rectum, melena, or hematemesis Neurologic: negative for visual changes, syncope All other systems reviewed and are otherwise negative except as noted above.  Labs:   Lab Results  Component Value Date   WBC 11.2* 01/27/2013   HGB 15.2* 01/27/2013   HCT 42.9 01/27/2013   MCV 88.3 01/27/2013   PLT 221 01/27/2013     Recent Labs Lab 01/27/13 2256  NA 135  K 4.0  CL 99  CO2 27  BUN 8  CREATININE 0.71  CALCIUM 9.2  PROT 7.4  BILITOT 0.5  ALKPHOS 64  ALT 12  AST 24  GLUCOSE 130*    Lab Results  Component Value Date   DDIMER 5.13* 01/28/2013    Radiology/Studies:  Dg Chest 2 View6/15/2014   *RADIOLOGY REPORT*  Clinical Data: Chest pain  CHEST - 2 VIEW  Comparison: 08/08/2010  Findings: Cardiomediastinal silhouette is stable.  No acute infiltrate or pleural effusion.  No pulmonary edema.  Bony thorax is stable.  IMPRESSION: No active disease.  No significant change.   Original Report Authenticated By: Natasha Mead, M.D.   Ct Angio Chest Pe W/cm &/or Wo Cm7/14/2014   *RADIOLOGY REPORT*  Clinical Data: Chest pain, shortness of breath and palpitations. Elevated D-dimer.  CT ANGIOGRAPHY CHEST  Technique:  Multidetector CT imaging of the chest using the standard protocol during bolus administration of intravenous contrast. Multiplanar reconstructed images including MIPs were obtained and reviewed to evaluate the vascular anatomy.  Contrast: OMNIPAQUE IOHEXOL 350 MG/ML SOLN  Comparison: Chest radiograph performed 12/30/2012  Findings: There is no evidence of pulmonary embolus.  The lungs appear clear bilaterally.  A few small lymph nodes are seen along the left  major fissure.  There is no evidence of significant focal consolidation, pleural effusion or pneumothorax. No masses are identified; no abnormal focal contrast enhancement is seen.  The mediastinum is unremarkable in appearance.  No mediastinal lymphadenopathy is seen.  No pericardial effusion is identified. The great vessels are unremarkable in appearance.  No axillary lymphadenopathy is seen.  The visualized portions of the thyroid gland are unremarkable in appearance.  The visualized portions of the liver and spleen are unremarkable.  No acute osseous abnormalities are seen.  IMPRESSION: No evidence of pulmonary embolus; lungs clear bilaterally.   Original Report Authenticated By: Tonia Ghent, M.D.     EKG:  7/13 22:51 sinus tach 117bpm nonspecific ST-T changes, QTc 611 F/u tracing 7/14 NSR 88bpm QTc 459  Physical Exam: Blood pressure 98/56, pulse 66, temperature 98.8 F (37.1 C), temperature source Oral, resp. rate 16, last menstrual  period 01/15/2013, SpO2 97.00%. General: Well developed, well nourished WF in no acute distress. Head: Normocephalic, atraumatic, sclera non-icteric, no xanthomas, nares are without discharge.  Neck: Negative for carotid bruits. JVD not elevated. Lungs: Clear bilaterally to auscultation without wheezes, rales, or rhonchi. Breathing is unlabored. Heart: RRR with S1 S2. No murmurs, rubs, or gallops appreciated. Abdomen: Soft, non-tender, non-distended with normoactive bowel sounds. No hepatomegaly. No rebound/guarding. No obvious abdominal masses. Msk:  Strength and tone appear normal for age. Extremities: No clubbing or cyanosis. No edema.  Distal pedal pulses are 2+ and equal bilaterally. Neuro: Alert and oriented X 3. No focal deficit. No facial asymmetry. Moves all extremities spontaneously. Psych:  Responds to questions appropriately with a normal affect.    ASSESSMENT AND PLAN:  1. Palpitations in the setting of sinus tach with transient QTc  prolongation 2. Chest pain, mostly atypical, with a component of dysphagia as well 3. Tobacco abuse 4. History of polysubstance abuse (opiates/heroin - now clean), maintained on chronic methadone 5. H/o positive PPD, pt did not yet go for INH - instructed to f/u health dept for further recommendations  The patient was seen and examined with Dr. Ladona Ridgel. Please see below for this addendum. Per his recommendations: - no strenuous activity for now beyond walking - avoid QTc prolonging medications - she needs to call prescribing doctor of methadone to discuss weaning off - avoid potassium-lowering medications - start atenolol 25mg  daily (rx'd electronically) - exercise treadmill stress test and followup at [facility] - office scheduling line was busy, so I left message for our office to call patient and schedule these appointments (office will call her - she was instructed to contact our office if she has not heard from them within 3 days)  Signed, Ronie Spies PA-C 01/28/2013, 12:40 PM  EP Consultation  Patient seen and examined. I have discussed the case with Ronie Spies, PA-C and amended her note as needed. The patient has long QT which has not been previously diagnosed but has become manifest while on Methadone which is known to cause QT prolongation and I suspect has unmasked her ECG. I have helped formulate the plan as above. She needs to wean off of methadone, avoid QT prolonging drugs and take a beta blocker. She does have chest pain but is low risk and I would recommend a regular exercise test which will also be helpful to evaluate QT during exercise to assess risk. She has never had syncope. Her current palpitations appear to be due to sinus tachycardia and not another arrhythmia.  Leonia Reeves.D.

## 2013-01-28 NOTE — ED Notes (Signed)
REPAGED [place] CARD MASTER TO CHECK ON DELAY IN SEEING PATIENT

## 2013-01-28 NOTE — [facility] Counselor (Signed)
Pt is a cardiac pt, not an ACT pt, ED note is inaccurate. Pt does not have "anxiety". Denice Bors, AADC 01/28/2013 4:30 PM

## 2013-01-28 NOTE — Progress Notes (Signed)
  Echocardiogram 2D Echocardiogram has been performed.  [name] 01/28/2013, 8:01 AM

## 2013-01-28 NOTE — ED Notes (Signed)
[facility] CARDS PA HAS GIVEN PT RX AND INSTRUCTIONS. SHE IS TO FOLLOW UP WITH THEM IN A FEW DAYS. AWAITING EDP TO DISPO

## 2013-02-04 ENCOUNTER — Telehealth: Payer: Self-pay | Admitting: Internal Medicine

## 2013-02-04 NOTE — Telephone Encounter (Addendum)
Pt was to be seen this week for ETT, she's down 02-07-13 with Nehemiah Settle an due to a death in her family she will not be here, the other PA's are booked, taylor on vacation, next thing with brooke 7-31, is it ok to move her out a week? Has cxl spot open 02-05-13 , pt coming then/mt

## 2013-02-05 ENCOUNTER — Encounter: Payer: Self-pay | Admitting: Nurse Practitioner

## 2013-02-05 ENCOUNTER — Encounter: Payer: Self-pay | Admitting: Internal Medicine

## 2013-02-05 ENCOUNTER — Ambulatory Visit (INDEPENDENT_AMBULATORY_CARE_PROVIDER_SITE_OTHER): Payer: Self-pay | Admitting: Nurse Practitioner

## 2013-02-05 ENCOUNTER — Encounter (INDEPENDENT_AMBULATORY_CARE_PROVIDER_SITE_OTHER): Payer: Self-pay

## 2013-02-05 ENCOUNTER — Encounter: Payer: Self-pay | Admitting: *Deleted

## 2013-02-05 VITALS — BP 103/67 | HR 73 | Resp 20

## 2013-02-05 DIAGNOSIS — R002 Palpitations: Secondary | ICD-10-CM

## 2013-02-05 DIAGNOSIS — R079 Chest pain, unspecified: Secondary | ICD-10-CM

## 2013-02-05 DIAGNOSIS — R42 Dizziness and giddiness: Secondary | ICD-10-CM

## 2013-02-05 DIAGNOSIS — R9431 Abnormal electrocardiogram [ECG] [EKG]: Secondary | ICD-10-CM

## 2013-02-05 NOTE — Patient Instructions (Addendum)
We are going to place a 24 hour Holter  We will get you a visit with Dr.Taylor  Minimize your caffeine and liberalize your salt use

## 2013-02-05 NOTE — Progress Notes (Signed)
Patient ID: [name], female   DOB: 04/20/1981, 32 y.o.   MRN: [id number] E-cardio 24 hour holter monitor applied to patient.

## 2013-02-05 NOTE — Progress Notes (Addendum)
Exercise Treadmill Test  Pre-Exercise Testing Evaluation Rhythm: normal sinus  Rate: 72     Test  Exercise Tolerance Test Ordering MD: Lewayne Bunting, MD  Interpreting MD: Norma Fredrickson, NP  Unique Test No: 1  Treadmill:  1  Indication for ETT: chest pain - rule out ischemia  Contraindication to ETT: No   Stress Modality: exercise - treadmill  Cardiac Imaging Performed: non   Protocol: standard Bruce - maximal  Max BP:  131/60  Max MPHR (bpm):  190 85% MPR (bpm):  162  MPHR obtained (bpm):  122 % MPHR obtained:  64%  Reached 85% MPHR (min:sec):  NA Total Exercise Time (min-sec):  8:45  Workload in METS:  10 Borg Scale: 16  Reason ETT Terminated:  patient's desire to stop    ST Segment Analysis At Rest: normal ST segments - no evidence of significant ST depression With Exercise: no evidence of significant ST depression  Other Information Arrhythmia:  No Angina during ETT:  absent (0) Quality of ETT:  non-diagnostic  ETT Interpretation:  normal - no evidence of ischemia by ST analysis  Comments: Patient presents today for routine GXT. Has had tachycardia and chest pain with a recent ER visit. Noted to have prolonged QT on EKG. Also on Methadone. Has had prior drug use and admits to a relapse 3 months ago (heroin). Was placed on atenolol and needs to have methadone weaned.  She has continued to have chest pain and palpitations. Numerous presyncopal spells. BP normally low.    Today, she exercised on the standard Bruce protocol for 8:45. Fair exercise tolerance. Test stopped due to fatigue and dyspnea. No chest pain. EKG negative for significant ST depression. Some nonspecific changes noted. No significant arrhythmia. One PAC and 2 PVCs noted during exercise. Adequate BP response. Resting EKG does not show any QT prolongation.   Recommendations: 24 hour Holter Liberalize salt to help keep her BP up.  Follow up with Dr. Ladona Ridgel as planned in early August.  Continue atenolol Wean  Methadone per the prescribing MD (this has been started). No drug use.  Patient is agreeable to this plan and will call if any problems develop in the interim.   Rosalio Macadamia, RN, ANP-C [facility] [address] [place], [place]  [address]

## 2013-02-07 ENCOUNTER — Emergency Department (HOSPITAL_COMMUNITY): Payer: Self-pay

## 2013-02-07 ENCOUNTER — Encounter: Payer: Self-pay | Admitting: Cardiology

## 2013-02-07 ENCOUNTER — Emergency Department (HOSPITAL_COMMUNITY)
Admission: EM | Admit: 2013-02-07 | Discharge: 2013-02-07 | Disposition: A | Discharge status: Home / Self Care | Payer: Self-pay | Attending: Emergency Medicine | Admitting: Emergency Medicine

## 2013-02-07 ENCOUNTER — Encounter (HOSPITAL_COMMUNITY): Payer: Self-pay | Admitting: Family Medicine

## 2013-02-07 DIAGNOSIS — F17200 Nicotine dependence, unspecified, uncomplicated: Secondary | ICD-10-CM | POA: Insufficient documentation

## 2013-02-07 DIAGNOSIS — R002 Palpitations: Secondary | ICD-10-CM | POA: Insufficient documentation

## 2013-02-07 DIAGNOSIS — R0789 Other chest pain: Secondary | ICD-10-CM | POA: Insufficient documentation

## 2013-02-07 DIAGNOSIS — R42 Dizziness and giddiness: Secondary | ICD-10-CM | POA: Insufficient documentation

## 2013-02-07 DIAGNOSIS — Z79899 Other long term (current) drug therapy: Secondary | ICD-10-CM | POA: Insufficient documentation

## 2013-02-07 DIAGNOSIS — R0602 Shortness of breath: Secondary | ICD-10-CM | POA: Insufficient documentation

## 2013-02-07 LAB — BASIC METABOLIC PANEL
BUN: 10 mg/dL (ref 6–23)
CO2: 29 mEq/L (ref 19–32)
Chloride: 102 mEq/L (ref 96–112)
Creatinine, Ser: 0.76 mg/dL (ref 0.50–1.10)
Glucose, Bld: 102 mg/dL — ABNORMAL HIGH (ref 70–99)

## 2013-02-07 LAB — POCT I-STAT TROPONIN I: Troponin i, poc: 0.04 ng/mL (ref 0.00–0.08)

## 2013-02-07 LAB — CBC
HCT: 41.8 % (ref 36.0–46.0)
MCV: 89.9 fL (ref 78.0–100.0)
RBC: 4.65 MIL/uL (ref 3.87–5.11)
WBC: 6.1 10*3/uL (ref 4.0–10.5)

## 2013-02-07 MED ORDER — PROMETHAZINE HCL 12.5 MG PO TABS
12.5000 mg | ORAL_TABLET | Freq: Once | ORAL | Status: AC
  Administered 2013-02-07: 12.5 mg via ORAL
  Filled 2013-02-07: qty 1

## 2013-02-07 MED ORDER — PROMETHAZINE HCL 25 MG PO TABS: 12.5000 mg | ORAL_TABLET | Freq: Four times a day (QID) | ORAL | Status: DC | PRN

## 2013-02-07 NOTE — ED Provider Notes (Signed)
CSN: [id number]     Arrival date & time 02/07/13  1647 History     First MD Initiated Contact with Patient 02/07/13 1734     Chief Complaint  Patient presents with  . Palpitations  . Chest Pain   (Consider location/radiation/quality/duration/timing/severity/associated sxs/prior Treatment) HPI  [name] is a 32 y.o.female with a significant PMH of tachycardia, chest pain and prolonged QT presents to the ER with complaints of chest pain, palpitations, being tired and shortness of breath onset 1 hour before arrival. She took a dose of her beta blocker and all of the symptoms resolved. Pt has been having episodes like these for a while now and was admitted 10 days ago and had cardiac echo, stress test and has worn a halter monitor. Is being managed by Dr. Sharrell Ku (cardiology). Echo and stress test were unremarkable. Pt is on methadone which prolongs QT interval and she reports that she has been titrating down on this medication but has been feeling more tired then normal since doing this. She is completely asymptomatic and came to the ER because the episode or tachycardia/palpations and pain scared here.  Past Medical History  Diagnosis Date  . 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 ? Needs INH.  Marland Kitchen Polysubstance abuse     a. 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  . Tobacco abuse    History reviewed. No pertinent past surgical history. Family History  Problem Relation Age of Onset  . CAD      Grandmother's father  . Other      Mother was worked up remotely for ?parasympathetic nervous system problem but cardiac workup was reportedly unremarkable   History  Substance Use Topics  . Smoking status: Current Every Day Smoker  . Smokeless tobacco: Not on file     Comment: Smoker since age 89  . Alcohol Use: No   OB History   Grav Para Term Preterm Abortions TAB SAB Ect Mult Living                 Review  of Systems  Respiratory: Positive for shortness of breath.   Cardiovascular: Positive for chest pain and palpitations.  Neurological: Positive for dizziness.  All other systems reviewed and are negative.    Allergies  Sulfa antibiotics  Home Medications   Current Outpatient Rx  Name  Route  Sig  Dispense  Refill  . atenolol (TENORMIN) 25 MG tablet   Oral   Take 1 tablet (25 mg total) by mouth daily.   30 tablet   2   . METHADONE HCL PO   Oral   Take 79 mg by mouth daily.         . promethazine (PHENERGAN) 25 MG tablet   Oral   Take 0.5 tablets (12.5 mg total) by mouth every 6 (six) hours as needed for nausea.   20 tablet   0    BP 96/52  Pulse 49  Temp(Src) 99 F (37.2 C) (Oral)  Resp 20  SpO2 99%  LMP 01/15/2013 Physical Exam  Nursing note and vitals reviewed. Constitutional: She appears well-developed and well-nourished. No distress.  HENT:  Head: Normocephalic and atraumatic.  Eyes: Pupils are equal, round, and reactive to light.  Neck: Normal range of motion. Neck supple.  Cardiovascular: Normal rate and regular rhythm.   No murmur heard. Pulmonary/Chest: Effort normal. She exhibits no tenderness, no swelling and no retraction.  Abdominal: Soft.  Neurological: She is alert.  Skin: Skin is warm and dry.    ED Course   Procedures (including critical care time)  Labs Reviewed  BASIC METABOLIC PANEL - Abnormal; Notable for the following:    Glucose, Bld 102 (*)    All other components within normal limits  CBC  POCT I-STAT TROPONIN I   Dg Chest 2 View  02/07/2013   *RADIOLOGY REPORT*  Clinical Data: Cardiac palpitations and chest pain  CHEST - 2 VIEW  Comparison:  December 30, 2012  Findings:  Lungs clear.  The heart size and pulmonary vascularity are normal.  No adenopathy.  No pneumothorax.  No bone lesions.  IMPRESSION: No abnormality noted.   Original Report Authenticated By: Bretta Bang, M.D.   1. Palpitations     MDM   Date:  02/07/2013  Rate: 51  Rhythm: sinus brady  QRS Axis: normal  Intervals: normal  ST/T Wave abnormalities: normal  Conduction Disutrbances:none  Narrative Interpretation:   Old EKG Reviewed: improved from 01/28/2013    Patients asymptomatic in ED. Currently being worked up for this problem by [employer], no acute changes. I have advised them to call [facility] tomorrow and let them know about visit to ED. Pt feeling nauseous while coming off of Methadone, Rx Phenergan 12.5 mg to help.  31 y.o.[name]'s evaluation in the Emergency Department is complete. It has been determined that no acute conditions requiring further emergency intervention are present at this time. The patient/guardian have been advised of the diagnosis and plan. We have discussed signs and symptoms that warrant return to the ED, such as changes or worsening in symptoms.  Vital signs are stable at discharge. Filed Vitals:   02/07/13 1820  BP: 96/52  Pulse:   Temp:   Resp: 20    Patient/guardian has voiced understanding and agreed to follow-up with the PCP or specialist.      Dorthula Matas, PA-C 02/07/13 1901

## 2013-02-07 NOTE — ED Provider Notes (Signed)
Medical screening examination/treatment/procedure(s) were performed by non-physician practitioner and as supervising physician I was immediately available for consultation/collaboration.   Charles B. Bernette Mayers, MD 02/07/13 1904

## 2013-02-07 NOTE — ED Notes (Signed)
Per pt sts palpitations and chest pain. sts she took a beta blocker about 30 min ago and palpations are better but still having chest pain. Recently dx with prolonged QT. Denies any other symptoms.

## 2013-02-07 NOTE — ED Notes (Signed)
Pt alert and oriented, with steady gait at time of discharge. Pt given discharge papers and papers explained. All questions answered and pt walked to discharge.  

## 2013-02-07 NOTE — ED Notes (Signed)
Xray made aware the patient is in the room at this time.

## 2013-02-13 ENCOUNTER — Telehealth: Payer: Self-pay | Admitting: Internal Medicine

## 2013-02-13 NOTE — Telephone Encounter (Signed)
Spoke with patient and let her know she should follow up with her PCP.  She has no PCP but I explained to her the importance of getting one.  She has a follow up on 02/15/13 with Dr Ladona Ridgel and I have advised her to keep this to discuss her plan of care with him

## 2013-02-13 NOTE — Telephone Encounter (Signed)
New prob  Pt states she fell down and her body was convulsing last night.  She said she did not lose consciousness.  She would like to speak with a nurse.

## 2013-02-13 NOTE — Telephone Encounter (Signed)
Follow up with PCP

## 2013-02-15 ENCOUNTER — Ambulatory Visit (INDEPENDENT_AMBULATORY_CARE_PROVIDER_SITE_OTHER): Payer: Self-pay | Admitting: Internal Medicine

## 2013-02-15 ENCOUNTER — Encounter: Payer: Self-pay | Admitting: Internal Medicine

## 2013-02-15 VITALS — BP 103/56 | HR 58 | Ht 59.0 in | Wt 119.0 lb

## 2013-02-15 DIAGNOSIS — R9431 Abnormal electrocardiogram [ECG] [EKG]: Secondary | ICD-10-CM

## 2013-02-15 MED ORDER — ATENOLOL 25 MG PO TABS: 25.0000 mg | ORAL_TABLET | ORAL | Status: DC | PRN

## 2013-02-15 NOTE — Assessment & Plan Note (Signed)
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 I have strongly encouraged the patient to reduce her dose of methadone and wean off as soon as possible. She is instructed to increase her atenolol to 25 mg twice or up to 3 or 4 times daily as needed to reduce rebound at adrenergic drive. I suspect her wrisk of sudden cardiac death is low as she has been on high-dose methadone for 6 years without symptoms. I have encouraged the patient to avoid any additional QT prolonging drugs. If she has palpitations, or chest pain, she is instructed to increase her dose of atenolol. If she has frank syncope, she is instructed to return to the emergency department.

## 2013-02-15 NOTE — Progress Notes (Signed)
HPI [name] returns for evaluation of prolongation of the QT interval and chest pain. The patient is a polysubstance abuser, who is on chronic methadone therapy. She presents to the emergency room several weeks ago with chest pain, palpitations, and was found to have a QT interval of over 600 ms. At that time, I recommended weaning of the methadone. The patient has had heroin relapses in the last several months. She also has intermittent episodes of chest pain associated with palpitations. In addition, she at times has trouble swallowing. There is no relationship to exertion with her chest discomfort. She has not had frank syncope. When she was initially seen, atenolol was recommended. She is quite anxious, accompanied today her fianc. Allergies  Allergen Reactions  . Sulfa Antibiotics Nausea Only     Current Outpatient Prescriptions  Medication Sig Dispense Refill  . atenolol (TENORMIN) 25 MG tablet Take 1 tablet (25 mg total) by mouth as needed.  60 tablet  0  . METHADONE HCL PO Take 68 mg by mouth daily.        No current facility-administered medications for this visit.     Past Medical History  Diagnosis Date  . 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 ? Needs INH.  Marland Kitchen Polysubstance abuse     a. 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  . Tobacco abuse     ROS:   All systems reviewed and negative except as noted in the HPI.   History reviewed. No pertinent past surgical history.   Family History  Problem Relation Age of Onset  . CAD      Grandmother's father  . Other      Mother was worked up remotely for ?parasympathetic nervous system problem but cardiac workup was reportedly unremarkable     History   Social History  . Marital Status: Single    Spouse Name: N/A    Number of Children: N/A  . Years of Education: N/A   Occupational History  . Not on file.   Social History Main Topics  .  Smoking status: Current Every Day Smoker  . Smokeless tobacco: Not on file     Comment: Smoker since age 43  . Alcohol Use: No  . Drug Use: No     Comment: Remote history of opiates, heroin  . Sexually Active: Not on file   Other Topics Concern  . Not on file   Social History Narrative  . No narrative on file     BP 103/56  Pulse 58  Ht 4\' 11"  (1.499 m)  Wt 119 lb (53.978 kg)  BMI 24.02 kg/m2  LMP 01/15/2013  Physical Exam:  stable appearing NAD HEENT: Unremarkable Neck:  No JVD, no thyromegally Back:  No CVA tenderness Lungs:  Clear with no wheezes, rales, or rhonchi. HEART:  Regular rate rhythm, no murmurs, no rubs, no clicks Abd:  soft, positive bowel sounds, no organomegally, no rebound, no guarding Ext:  2 plus pulses, no edema, no cyanosis, no clubbing Skin:  No rashes no nodules, healed skin tracks appeared to present on the upper extremities. Neuro:  CN II through XII intact, motor grossly intact  EKG - sinus rhythm with borderline QT prolongation. The T waves are of small magnitude.   Assess/Plan:

## 2013-02-15 NOTE — Patient Instructions (Signed)
Your physician wants you to follow-up in: 6 months with Dr Court Joy will receive a reminder letter in the mail two months in advance. If you don't receive a letter, please call our office to schedule the follow-up appointment.  Your physician has recommended you make the following change in your medication:  1) Stasrt Atenolol 25mg  as needed

## 2013-02-20 ENCOUNTER — Telehealth: Payer: Self-pay

## 2013-02-20 NOTE — Telephone Encounter (Signed)
[facility] on [address] faxed her Rx for atenolol 25mg  (prn) back stating that pt has been taking one tablet every day. I called pharm & they told me the last refill she picked up on 8/2 said "take one tablet daily". I called pt to tell her her Rx should read take one tab AS NEEDED. I called her at home at 10:20am & it said " The person you have called is unavailable right now please try again later". I then tried her cell & that said "The person you are trying to reach is not accepting calls at this time please try again later". I called both numbers again at 2:00pm & 3:30pm & got the same messages.

## 2013-02-27 ENCOUNTER — Telehealth: Payer: Self-pay

## 2013-02-27 NOTE — Telephone Encounter (Signed)
Called pt to confirm she is only taking Tenormin prn, NOT once a day. She is taking it as needed. Pt wanted to schedule an appt. with Dr. Ladona Ridgel soon because of recent issues she is having. I am at [facility] today so I asked her to call the [facility] office to make that appt. Pt agreed.

## 2013-03-28 ENCOUNTER — Other Ambulatory Visit: Payer: Self-pay | Admitting: Internal Medicine

## 2013-08-21 ENCOUNTER — Encounter: Payer: Self-pay | Admitting: Internal Medicine

## 2013-08-21 ENCOUNTER — Ambulatory Visit: Payer: Self-pay | Admitting: Internal Medicine

## 2013-08-21 ENCOUNTER — Ambulatory Visit (INDEPENDENT_AMBULATORY_CARE_PROVIDER_SITE_OTHER): Payer: Self-pay | Admitting: Internal Medicine

## 2013-08-21 VITALS — BP 106/61 | HR 53 | Ht 59.0 in | Wt 119.0 lb

## 2013-08-21 DIAGNOSIS — R9431 Abnormal electrocardiogram [ECG] [EKG]: Secondary | ICD-10-CM

## 2013-08-21 DIAGNOSIS — R002 Palpitations: Secondary | ICD-10-CM | POA: Insufficient documentation

## 2013-08-21 MED ORDER — ATENOLOL 25 MG PO TABS: 25.0000 mg | ORAL_TABLET | Freq: Every day | ORAL | Status: DC

## 2013-08-21 NOTE — Assessment & Plan Note (Signed)
I suspect this is due to her caffiene use. She drinks a 24 oz coffee a day. I have asked her to reduce herself to half and half caffeine.

## 2013-08-21 NOTE — Progress Notes (Signed)
HPI [name] returns for evaluation of prolongation of the QT interval and chest pain. The patient is a polysubstance abuser, who is on chronic methadone therapy. She presents to the emergency room several months ago with chest pain, palpitations, and was found to have a QT interval of over 600 ms. At that time, I recommended weaning of the methadone. The patient has had heroin relapses in the past. She also has intermittent episodes of chest pain associated with palpitations. In addition, she notes some dizziness with standing. Allergies  Allergen Reactions  . Sulfa Antibiotics Nausea Only     Current Outpatient Prescriptions  Medication Sig Dispense Refill  . atenolol (TENORMIN) 25 MG tablet TAKE ONE TABLET BY MOUTH  TWICE DAILY      . METHADONE HCL PO Take 42 mg by mouth daily.        No current facility-administered medications for this visit.     Past Medical History  Diagnosis Date  . 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 ? Needs INH.  Marland Kitchen. Polysubstance abuse     a. 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  . Tobacco abuse     ROS:   All systems reviewed and negative except as noted in the HPI.   History reviewed. No pertinent past surgical history.   Family History  Problem Relation Age of Onset  . CAD      Grandmother's father  . Other      Mother was worked up remotely for ?parasympathetic nervous system problem but cardiac workup was reportedly unremarkable     History   Social History  . Marital Status: Single    Spouse Name: N/A    Number of Children: N/A  . Years of Education: N/A   Occupational History  . Not on file.   Social History Main Topics  . Smoking status: Current Every Day Smoker  . Smokeless tobacco: Not on file     Comment: Smoker since age 33  . Alcohol Use: No  . Drug Use: No     Comment: Remote history of opiates, heroin  . Sexual Activity: Not on file    Other Topics Concern  . Not on file   Social History Narrative  . No narrative on file     BP 106/61  Pulse 53  Ht 4\' 11"  (1.499 m)  Wt 119 lb (53.978 kg)  BMI 24.02 kg/m2  Physical Exam:  stable appearing NAD HEENT: Unremarkable Neck:  No JVD, no thyromegally Back:  No CVA tenderness Lungs:  Clear with no wheezes, rales, or rhonchi. HEART:  Regular rate rhythm, no murmurs, no rubs, no clicks Abd:  soft, positive bowel sounds, no organomegally, no rebound, no guarding Ext:  2 plus pulses, no edema, no cyanosis, no clubbing Skin:  No rashes no nodules, healed skin tracks appeared to present on the upper extremities. Neuro:  CN II through XII intact, motor grossly intact  EKG - sinus rhythm with no QT prolongation. The T waves are of small magnitude.   Assess/Plan:

## 2013-08-21 NOTE — Assessment & Plan Note (Signed)
Her methadone dose has been reduced and her QT interval is improved on her beta blocker. She does have some fatigue and dizziness and I have asked her to reduce her dose of atenolol. I will defer additional reductions in methadone to her counselor.

## 2013-08-21 NOTE — Patient Instructions (Signed)
Your physician has recommended you make the following change in your medication:  1) Decrease Atenolol to 25 mg once daily  Decrease your caffeine intake in half  Your physician wants you to follow-up in: 6 months with Dr. Ladona Ridgelaylor.  You will receive a reminder letter in the mail two months in advance. If you don't receive a letter, please call our office to schedule the follow-up appointment.

## 2014-01-27 ENCOUNTER — Encounter (HOSPITAL_COMMUNITY): Payer: Self-pay | Admitting: Emergency Medicine

## 2014-01-27 ENCOUNTER — Emergency Department (HOSPITAL_COMMUNITY)
Admission: EM | Admit: 2014-01-27 | Discharge: 2014-01-27 | Discharge status: Against Medical Advice | Payer: Self-pay | Attending: Emergency Medicine | Admitting: Emergency Medicine

## 2014-01-27 DIAGNOSIS — R143 Flatulence: Secondary | ICD-10-CM

## 2014-01-27 DIAGNOSIS — Z3202 Encounter for pregnancy test, result negative: Secondary | ICD-10-CM | POA: Insufficient documentation

## 2014-01-27 DIAGNOSIS — R141 Gas pain: Secondary | ICD-10-CM | POA: Insufficient documentation

## 2014-01-27 DIAGNOSIS — F17200 Nicotine dependence, unspecified, uncomplicated: Secondary | ICD-10-CM | POA: Insufficient documentation

## 2014-01-27 DIAGNOSIS — R142 Eructation: Secondary | ICD-10-CM

## 2014-01-27 DIAGNOSIS — R109 Unspecified abdominal pain: Secondary | ICD-10-CM | POA: Insufficient documentation

## 2014-01-27 DIAGNOSIS — Z79899 Other long term (current) drug therapy: Secondary | ICD-10-CM | POA: Insufficient documentation

## 2014-01-27 HISTORY — DX: Long QT syndrome: I45.81

## 2014-01-27 LAB — CBC WITH DIFFERENTIAL/PLATELET
Basophils Absolute: 0 10*3/uL (ref 0.0–0.1)
Basophils Relative: 0 % (ref 0–1)
EOS ABS: 0.2 10*3/uL (ref 0.0–0.7)
EOS PCT: 3 % (ref 0–5)
HCT: 44 % (ref 36.0–46.0)
Hemoglobin: 15.5 g/dL — ABNORMAL HIGH (ref 12.0–15.0)
LYMPHS ABS: 2.4 10*3/uL (ref 0.7–4.0)
Lymphocytes Relative: 39 % (ref 12–46)
MCH: 32.4 pg (ref 26.0–34.0)
MCHC: 35.2 g/dL (ref 30.0–36.0)
MCV: 91.9 fL (ref 78.0–100.0)
MONO ABS: 0.8 10*3/uL (ref 0.1–1.0)
MONOS PCT: 13 % — AB (ref 3–12)
Neutro Abs: 2.7 10*3/uL (ref 1.7–7.7)
Neutrophils Relative %: 45 % (ref 43–77)
PLATELETS: 233 10*3/uL (ref 150–400)
RBC: 4.79 MIL/uL (ref 3.87–5.11)
RDW: 11.7 % (ref 11.5–15.5)
WBC: 6 10*3/uL (ref 4.0–10.5)

## 2014-01-27 LAB — POC URINE PREG, ED: PREG TEST UR: NEGATIVE

## 2014-01-27 LAB — URINALYSIS, ROUTINE W REFLEX MICROSCOPIC
BILIRUBIN URINE: NEGATIVE
Glucose, UA: NEGATIVE mg/dL
Hgb urine dipstick: NEGATIVE
KETONES UR: 15 mg/dL — AB
LEUKOCYTES UA: NEGATIVE
NITRITE: NEGATIVE
PROTEIN: NEGATIVE mg/dL
Specific Gravity, Urine: 1.021 (ref 1.005–1.030)
Urobilinogen, UA: 1 mg/dL (ref 0.0–1.0)
pH: 5 (ref 5.0–8.0)

## 2014-01-27 LAB — COMPREHENSIVE METABOLIC PANEL
ALT: 9 U/L (ref 0–35)
ANION GAP: 11 (ref 5–15)
AST: 14 U/L (ref 0–37)
Albumin: 3.9 g/dL (ref 3.5–5.2)
Alkaline Phosphatase: 49 U/L (ref 39–117)
BUN: 9 mg/dL (ref 6–23)
CALCIUM: 9 mg/dL (ref 8.4–10.5)
CO2: 27 mEq/L (ref 19–32)
CREATININE: 0.62 mg/dL (ref 0.50–1.10)
Chloride: 104 mEq/L (ref 96–112)
GFR calc non Af Amer: >90 mL/min (ref >90)
GLUCOSE: 89 mg/dL (ref 70–99)
Potassium: 4.4 mEq/L (ref 3.7–5.3)
SODIUM: 142 meq/L (ref 137–147)
TOTAL PROTEIN: 7.2 g/dL (ref 6.0–8.3)
Total Bilirubin: 0.3 mg/dL (ref 0.3–1.2)

## 2014-01-27 LAB — LIPASE, BLOOD: LIPASE: 13 U/L (ref 11–59)

## 2014-01-27 NOTE — ED Notes (Signed)
Presents with abdominal distention and abdominal pain intermittently for 2 years. Over the last 2 months reports "spasm pain in the side of my abdomen and in my back, they usually go away and are not this bad, but today they are unbearable" nothing makes pain worse, nothing makes pain better.  Non tender to palpation. Last BM this am -normal.  denies peripheral edema. Denies vaginal discharge. Pt is currently on menstrual period.

## 2014-02-09 ENCOUNTER — Other Ambulatory Visit: Payer: Self-pay | Admitting: Internal Medicine

## 2014-03-17 ENCOUNTER — Other Ambulatory Visit: Payer: Self-pay | Admitting: Internal Medicine

## 2014-03-18 ENCOUNTER — Other Ambulatory Visit: Payer: Self-pay | Admitting: Internal Medicine

## 2014-03-27 ENCOUNTER — Other Ambulatory Visit: Payer: Self-pay | Admitting: *Deleted

## 2014-03-27 ENCOUNTER — Ambulatory Visit (INDEPENDENT_AMBULATORY_CARE_PROVIDER_SITE_OTHER): Payer: Self-pay | Admitting: Internal Medicine

## 2014-03-27 ENCOUNTER — Encounter: Payer: Self-pay | Admitting: Internal Medicine

## 2014-03-27 VITALS — BP 84/62 | HR 57 | Ht 59.0 in | Wt 113.0 lb

## 2014-03-27 DIAGNOSIS — R9431 Abnormal electrocardiogram [ECG] [EKG]: Secondary | ICD-10-CM

## 2014-03-27 MED ORDER — ATENOLOL 25 MG PO TABS: ORAL_TABLET | ORAL | Status: DC

## 2014-03-27 NOTE — Assessment & Plan Note (Signed)
Her QT prolongation is stable as her dose of methadone has been gradually reduced. I have recommended she continue weaning methadone slowly. She is to avoid QT prolonging medications. She may reduce her beta blocker by half as her blood pressure has been running on the low side.

## 2014-03-27 NOTE — Patient Instructions (Signed)
Your physician wants you to follow-up in: 12 months with Dr. Taylor. You will receive a reminder letter in the mail two months in advance. If you don't receive a letter, please call our office to schedule the follow-up appointment.    

## 2014-03-27 NOTE — Progress Notes (Signed)
HPI [name] returns for evaluation of prolongation of the QT interval and chest pain. The patient is a polysubstance abuser, who is on chronic methadone therapy. She has gradually reduced her methadone from 80 mg daily down to 30 mg daily. She has not had any relapses of heroin abuse. She still is smoking. She notes that at times she will feel palpitations. She has been dizzy on her beta blocker. Sometimes she feels weak. No frank syncope. Allergies  Allergen Reactions  . Sulfa Antibiotics Nausea Only     Current Outpatient Prescriptions  Medication Sig Dispense Refill  . atenolol (TENORMIN) 25 MG tablet TAKE ONE TABLET BY MOUTH ONCE DAILY  30 tablet  0  . METHADONE HCL PO Take 30 mg by mouth daily.        No current facility-administered medications for this visit.     Past Medical History  Diagnosis Date  . 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 ? Needs INH.  Marland Kitchen Polysubstance abuse     a. 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  . Tobacco abuse   . Long Q-T syndrome     ROS:   All systems reviewed and negative except as noted in the HPI.   History reviewed. No pertinent past surgical history.   Family History  Problem Relation Age of Onset  . CAD      Grandmother's father  . Other      Mother was worked up remotely for ?parasympathetic nervous system problem but cardiac workup was reportedly unremarkable     History   Social History  . Marital Status: Single    Spouse Name: N/A    Number of Children: N/A  . Years of Education: N/A   Occupational History  . Not on file.   Social History Main Topics  . Smoking status: Current Every Day Smoker  . Smokeless tobacco: Not on file     Comment: Smoker since age 63  . Alcohol Use: No  . Drug Use: No     Comment: Remote history of opiates, heroin  . Sexual Activity: Not on file   Other Topics Concern  . Not on file   Social History  Narrative  . No narrative on file     BP 84/62  Pulse 57  Ht  (1.499 m)  Wt 113 lb (51.256 kg)  BMI 22.81 kg/m2  Physical Exam:  stable appearing young woman, NAD HEENT: Unremarkable Neck:  No JVD, no thyromegally Back:  No CVA tenderness Lungs:  Clear with no wheezes, rales, or rhonchi. HEART:  Regular rate rhythm, no murmurs, no rubs, no clicks Abd:  soft, positive bowel sounds, no organomegally, no rebound, no guarding Ext:  2 plus pulses, no edema, no cyanosis, no clubbing Skin:  No rashes no nodules, healed skin tracks appeared to present on the upper extremities. Neuro:  CN II through XII intact, motor grossly intact  EKG - sinus rhythm with no QT prolongation. The T waves are of small magnitude.   Assess/Plan:

## 2014-09-14 ENCOUNTER — Encounter (HOSPITAL_COMMUNITY): Payer: Self-pay | Admitting: Emergency Medicine

## 2014-09-14 ENCOUNTER — Emergency Department (HOSPITAL_COMMUNITY): Payer: Self-pay

## 2014-09-14 ENCOUNTER — Emergency Department (HOSPITAL_COMMUNITY)
Admission: EM | Admit: 2014-09-14 | Discharge: 2014-09-14 | Disposition: A | Discharge status: Home / Self Care | Payer: Self-pay | Attending: Emergency Medicine | Admitting: Emergency Medicine

## 2014-09-14 DIAGNOSIS — Z79899 Other long term (current) drug therapy: Secondary | ICD-10-CM | POA: Insufficient documentation

## 2014-09-14 DIAGNOSIS — R079 Chest pain, unspecified: Secondary | ICD-10-CM

## 2014-09-14 DIAGNOSIS — Z720 Tobacco use: Secondary | ICD-10-CM | POA: Insufficient documentation

## 2014-09-14 DIAGNOSIS — R0789 Other chest pain: Secondary | ICD-10-CM | POA: Insufficient documentation

## 2014-09-14 DIAGNOSIS — Z8679 Personal history of other diseases of the circulatory system: Secondary | ICD-10-CM | POA: Insufficient documentation

## 2014-09-14 DIAGNOSIS — Z3202 Encounter for pregnancy test, result negative: Secondary | ICD-10-CM | POA: Insufficient documentation

## 2014-09-14 LAB — BASIC METABOLIC PANEL
Anion gap: 7 (ref 5–15)
BUN: 6 mg/dL (ref 6–23)
CO2: 26 mmol/L (ref 19–32)
Calcium: 9 mg/dL (ref 8.4–10.5)
Chloride: 103 mmol/L (ref 96–112)
Creatinine, Ser: 0.62 mg/dL (ref 0.50–1.10)
GFR calc Af Amer: >90 mL/min (ref >90)
GFR calc non Af Amer: >90 mL/min (ref >90)
Glucose, Bld: 101 mg/dL — ABNORMAL HIGH (ref 70–99)
Potassium: 4.4 mmol/L (ref 3.5–5.1)
Sodium: 136 mmol/L (ref 135–145)

## 2014-09-14 LAB — CBC WITH DIFFERENTIAL/PLATELET
Basophils Absolute: 0 10*3/uL (ref 0.0–0.1)
Basophils Relative: 0 % (ref 0–1)
Eosinophils Absolute: 0.2 10*3/uL (ref 0.0–0.7)
Eosinophils Relative: 2 % (ref 0–5)
HCT: 43.8 % (ref 36.0–46.0)
Hemoglobin: 15.5 g/dL — ABNORMAL HIGH (ref 12.0–15.0)
Lymphocytes Relative: 31 % (ref 12–46)
Lymphs Abs: 2.5 10*3/uL (ref 0.7–4.0)
MCH: 31.5 pg (ref 26.0–34.0)
MCHC: 35.4 g/dL (ref 30.0–36.0)
MCV: 89 fL (ref 78.0–100.0)
Monocytes Absolute: 0.8 10*3/uL (ref 0.1–1.0)
Monocytes Relative: 10 % (ref 3–12)
Neutro Abs: 4.6 10*3/uL (ref 1.7–7.7)
Neutrophils Relative %: 57 % (ref 43–77)
Platelets: 246 10*3/uL (ref 150–400)
RBC: 4.92 MIL/uL (ref 3.87–5.11)
RDW: 11.8 % (ref 11.5–15.5)
WBC: 8 10*3/uL (ref 4.0–10.5)

## 2014-09-14 LAB — URINALYSIS, ROUTINE W REFLEX MICROSCOPIC
Bilirubin Urine: NEGATIVE
Glucose, UA: NEGATIVE mg/dL
Hgb urine dipstick: NEGATIVE
Ketones, ur: NEGATIVE mg/dL
Leukocytes, UA: NEGATIVE
Nitrite: NEGATIVE
Protein, ur: NEGATIVE mg/dL
Specific Gravity, Urine: 1.005 (ref 1.005–1.030)
Urobilinogen, UA: 0.2 mg/dL (ref 0.0–1.0)
pH: 7 (ref 5.0–8.0)

## 2014-09-14 LAB — I-STAT TROPONIN, ED: Troponin i, poc: 0.02 ng/mL (ref 0.00–0.08)

## 2014-09-14 LAB — D-DIMER, QUANTITATIVE: D-Dimer, Quant: 0.51 ug/mL-FEU — ABNORMAL HIGH (ref 0.00–0.48)

## 2014-09-14 LAB — PREGNANCY, URINE: Preg Test, Ur: NEGATIVE

## 2014-09-14 MED ORDER — IBUPROFEN 800 MG PO TABS: 800.0000 mg | ORAL_TABLET | Freq: Three times a day (TID) | ORAL | Status: DC | PRN

## 2014-09-14 MED ORDER — IOHEXOL 350 MG/ML SOLN
100.0000 mL | Freq: Once | INTRAVENOUS | Status: AC | PRN
  Administered 2014-09-14: 100 mL via INTRAVENOUS

## 2014-09-14 NOTE — Discharge Instructions (Signed)
Return here as needed.  Your CT scan of your chest did not show any abnormality.  Follow up with a primary care doctor

## 2014-09-14 NOTE — ED Notes (Signed)
Three days ago chest pressure and tightness started; no cough or congestion; increased SOB feeling like cannot get enough air intermittently. Currently feels ok, pressure is intermittent also.

## 2014-09-14 NOTE — ED Provider Notes (Signed)
CSN: [id number]     Arrival date & time 09/14/14  4098 History   First MD Initiated Contact with Patient 09/14/14 0940     Chief Complaint  Patient presents with  . Shortness of Breath     (Consider location/radiation/quality/duration/timing/severity/associated sxs/prior Treatment) HPI Patient presents to the emergency department with right-sided chest discomfort that started yesterday.  Patient, states she has also had some cough and increasing pain with deep breathing.  The patient states that she was concerned because in several days since the symptoms started.  She states that she does have prolonged QT, and she was worried about this being a factor.  The patient states that she did not take any medications prior to arrival.  She was worried that it may be something related to positive TB test as well, so she came in for evaluation.  The patient denies nausea, vomiting, fever, runny nose, sore throat, back pain, neck pain, weakness, dizziness, headache, blurred vision, or syncope.  The patient states nothing seems make her condition better Past Medical History  Diagnosis Date  . 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 ? Needs INH.  Marland Kitchen Polysubstance abuse     a. 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  . Tobacco abuse   . Long Q-T syndrome    History reviewed. No pertinent past surgical history. Family History  Problem Relation Age of Onset  . CAD      Grandmother's father  . Other      Mother was worked up remotely for ?parasympathetic nervous system problem but cardiac workup was reportedly unremarkable   History  Substance Use Topics  . Smoking status: Current Every Day Smoker -- 2.00 packs/day  . Smokeless tobacco: Not on file     Comment: Smoker since age 74  . Alcohol Use: No   OB History    No data available     Review of Systems   All other systems negative except as documented in the HPI. All  pertinent positives and negatives as reviewed in the HPI. Allergies  Sulfa antibiotics  Home Medications   Prior to Admission medications   Medication Sig Start Date End Date Taking? Authorizing Provider  atenolol (TENORMIN) 25 MG tablet TAKE ONE TABLET BY MOUTH ONCE DAILY Patient taking differently: Take 12.5 mg by mouth 2 (two) times daily.  03/27/14  Yes Marinus Maw, MD  METHADONE HCL PO Take 25 mLs by mouth daily.    Yes Historical Provider, MD   BP 101/58 mmHg  Pulse 62  Temp(Src) 98.8 F (37.1 C) (Oral)  Resp 11  SpO2 98%  LMP 08/27/2014 Physical Exam  Constitutional: She is oriented to person, place, and time. She appears well-developed and well-nourished. No distress.  HENT:  Head: Normocephalic and atraumatic.  Mouth/Throat: Oropharynx is clear and moist.  Eyes: Pupils are equal, round, and reactive to light.  Neck: Normal range of motion. Neck supple.  Cardiovascular: Normal rate, regular rhythm and normal heart sounds.  Exam reveals no gallop and no friction rub.   No murmur heard. Pulmonary/Chest: Effort normal and breath sounds normal. No respiratory distress. She has no wheezes.  Musculoskeletal: She exhibits no edema.  Neurological: She is alert and oriented to person, place, and time. She exhibits normal muscle tone. Coordination normal.  Skin: Skin is warm and dry. No rash noted. No erythema.  Nursing note and vitals reviewed.   ED Course  Procedures (  including critical care time) Labs Review Labs Reviewed  CBC WITH DIFFERENTIAL/PLATELET - Abnormal; Notable for the following:    Hemoglobin 15.5 (*)    All other components within normal limits  D-DIMER, QUANTITATIVE - Abnormal; Notable for the following:    D-Dimer, Quant 0.51 (*)    All other components within normal limits  BASIC METABOLIC PANEL - Abnormal; Notable for the following:    Glucose, Bld 101 (*)    All other components within normal limits  URINALYSIS, ROUTINE W REFLEX MICROSCOPIC   PREGNANCY, URINE  I-STAT TROPOININ, ED    Imaging Review Dg Chest 2 View  09/14/2014   CLINICAL DATA:  Right chest/scapular pain  EXAM: CHEST  2 VIEW  COMPARISON:  02/07/2013  FINDINGS: Lungs are clear.  No pleural effusion or pneumothorax.  The heart is normal in size.  Visualized osseous structures are within normal limits.  IMPRESSION: Normal chest radiographs.   Electronically Signed   By: Charline BillsSriyesh  Krishnan M.D.   On: 09/14/2014 11:55   Ct Angio Chest Pe W/cm &/or Wo Cm  09/14/2014   CLINICAL DATA:  Shortness of breath, chest pressure x3 days  EXAM: CT ANGIOGRAPHY CHEST WITH CONTRAST  TECHNIQUE: Multidetector CT imaging of the chest was performed using the standard protocol during bolus administration of intravenous contrast. Multiplanar CT image reconstructions and MIPs were obtained to evaluate the vascular anatomy.  CONTRAST:  100mL OMNIPAQUE IOHEXOL 350 MG/ML SOLN  COMPARISON:  Chest radiographs dated 09/06/2014. CT chest dated 01/28/2013.  FINDINGS: No evidence of pulmonary embolism.  Mediastinum/Nodes: Heart is normal in size. No pericardial effusion.  No suspicious mediastinal, hilar, or axillary lymphadenopathy.  Visualized thyroid is unremarkable.  Lungs/Pleura: Evaluation of the lung parenchyma is constrained by respiratory motion.  Mild dependent atelectasis in the bilateral lower lobes.  No focal consolidation.  No suspicious pulmonary nodules.  No pleural effusion or pneumothorax.  Upper abdomen: Visualized upper abdomen is within normal limits.  Musculoskeletal: Visualized osseous structures are within normal limits.  Review of the MIP images confirms the above findings.  IMPRESSION: No evidence of pulmonary embolism.  No evidence of acute cardiopulmonary disease.   Electronically Signed   By: Charline BillsSriyesh  Krishnan M.D.   On: 09/14/2014 15:25     EKG Interpretation   Date/Time:  Sunday September 14 2014 09:31:59 EST Ventricular Rate:  72 PR Interval:  170 QRS Duration: 56 QT  Interval:  380 QTC Calculation: 416 R Axis:   83 Text Interpretation:  Normal sinus rhythm Anteroseptal infarct , age  undetermined Baseline wander When compared with ECG of 02/07/2013 No  significant change was found Confirmed by [facility]  MD, Nicholos JohnsKATHLEEN [phone]([id number])  on 09/14/2014 9:51:59 AM     Patient will be treated for chest wall pain.  The patient does not have any signs of pneumonia, pulmonary embolus, and this seems unlikely to be her heart as she goes bouts of pain that are sharp in nature that last for seconds.  Patient states that she is currently treated with methadone for addiction MDM   Final diagnoses:  Chest pain        Carlyle DollyChristopher W Demetre Monaco, PA-C 09/14/14 1540  Samuel JesterKathleen McManus, DO 09/15/14 1031

## 2015-02-04 IMAGING — CR DG CHEST 2V
2 series · 2 of 2 positions shown · non-contrast
Comparison: 08/08/2010

CLINICAL DATA: Chest pain

CHEST - 2 VIEW

[w chest pa]
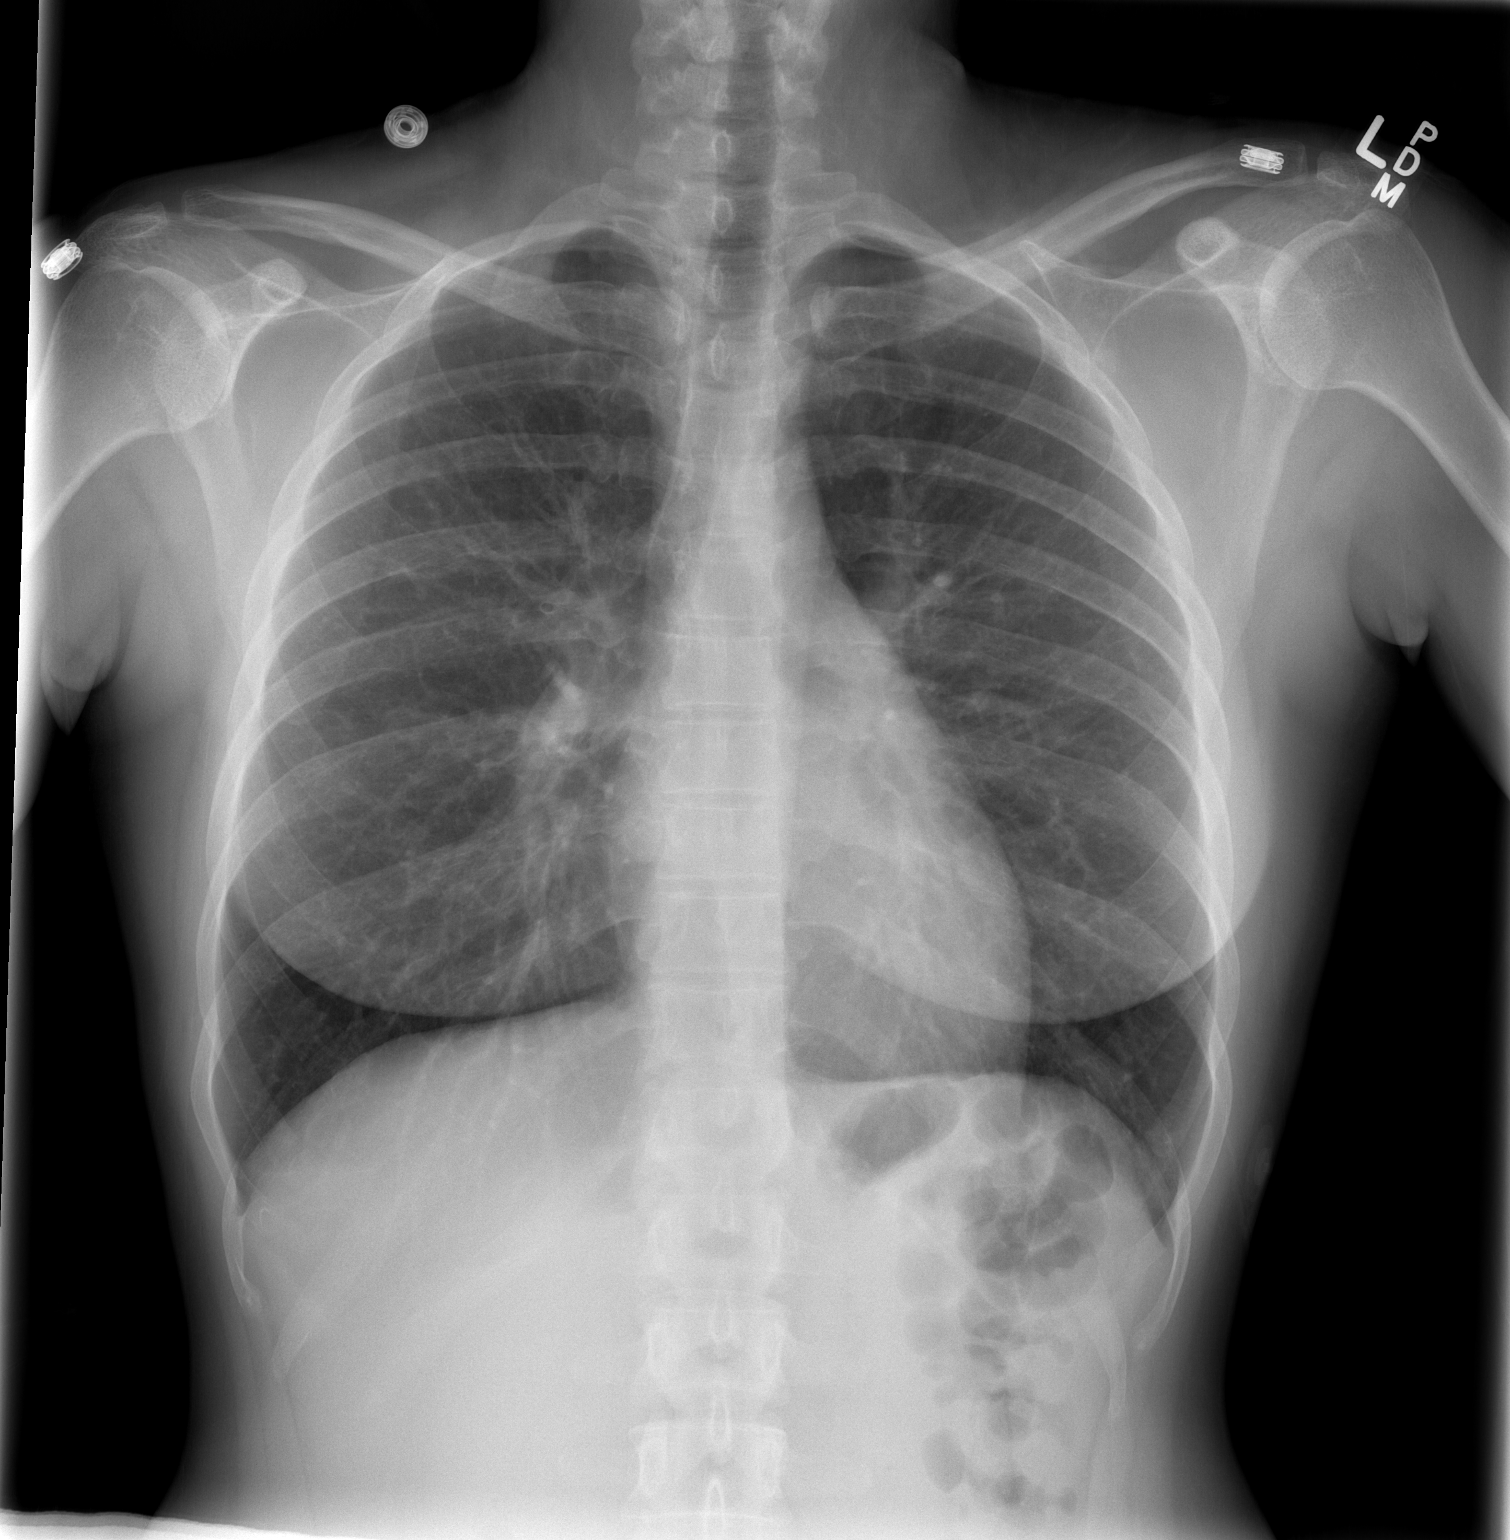

[w chest lat]
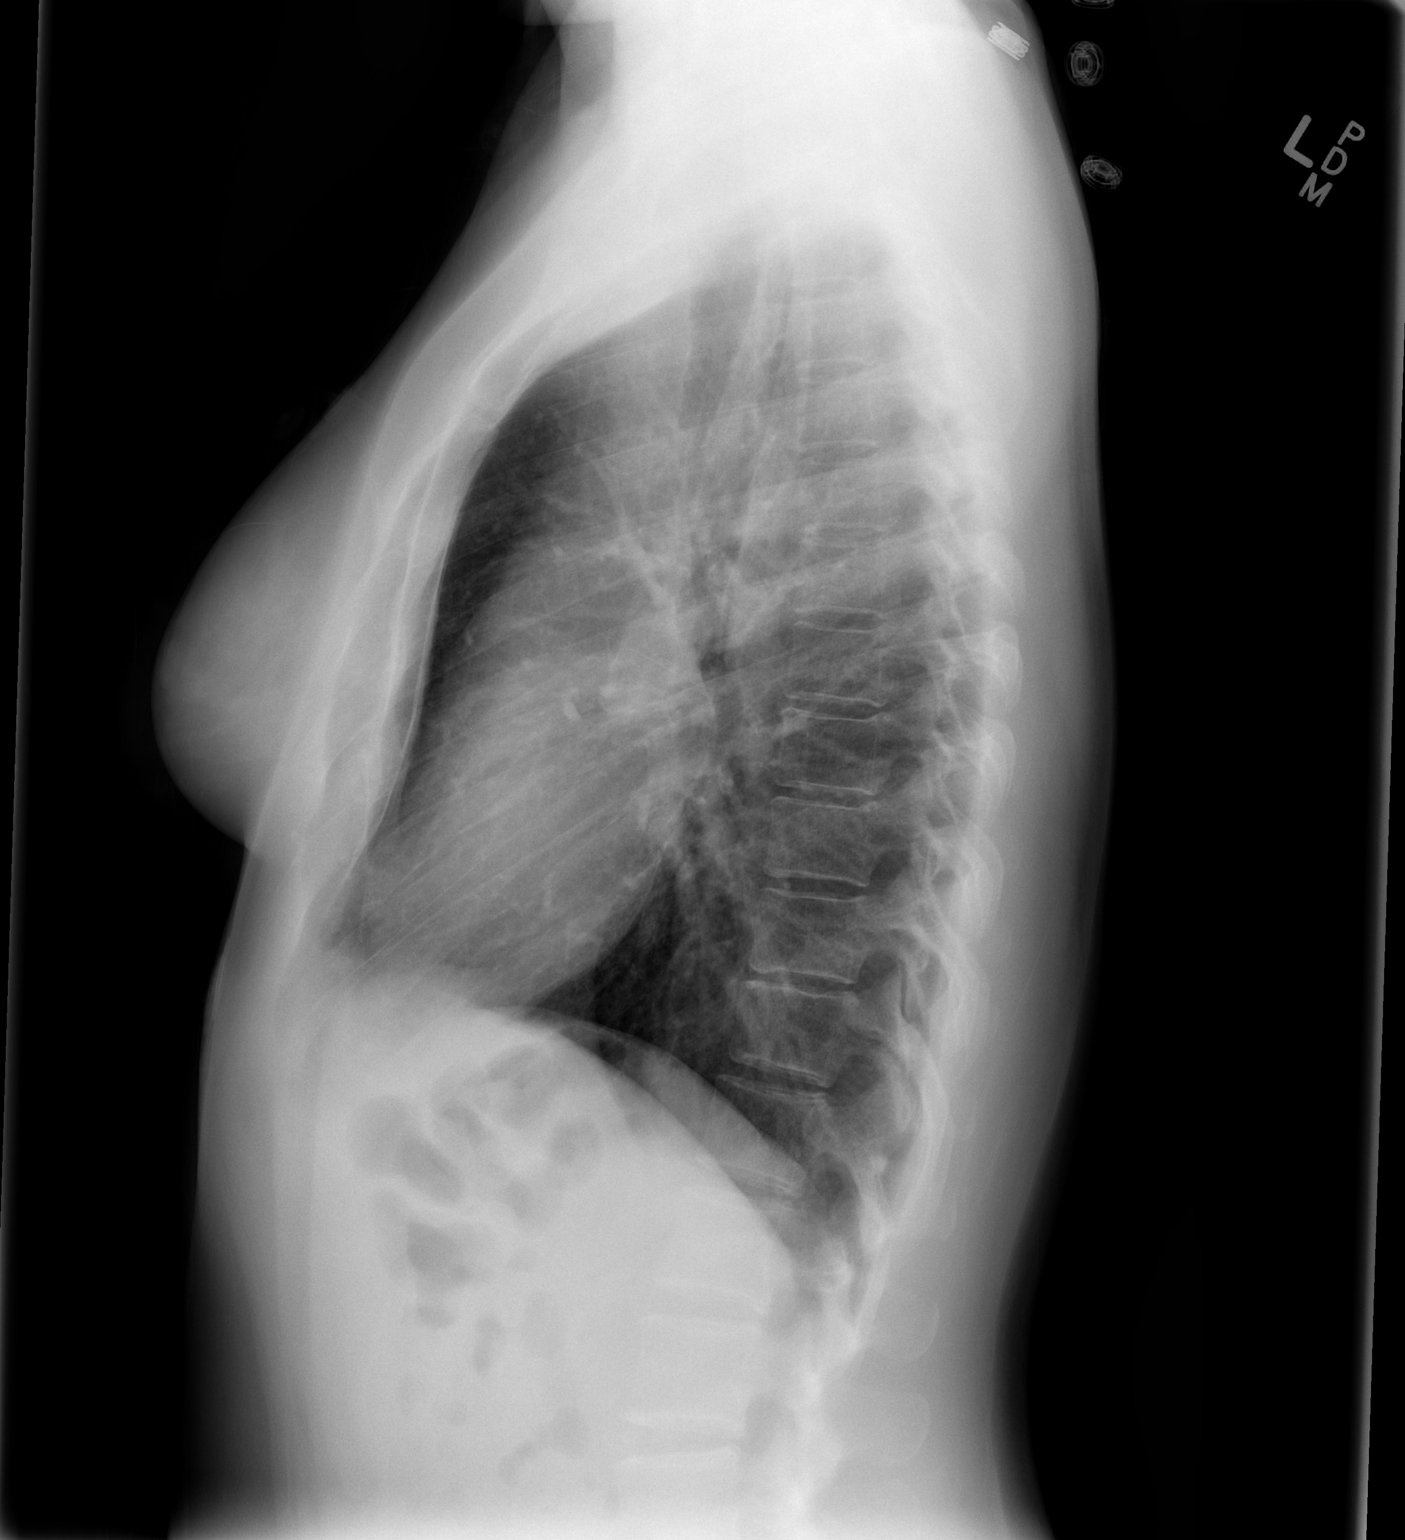

[2 of 2 positions shown; findings below may reference images not displayed]

FINDINGS: Cardiomediastinal silhouette is stable.  No acute
infiltrate or pleural effusion.  No pulmonary edema.  Bony thorax
is stable.
IMPRESSION: No active disease.  No significant change.

## 2015-04-03 ENCOUNTER — Ambulatory Visit (INDEPENDENT_AMBULATORY_CARE_PROVIDER_SITE_OTHER): Payer: Self-pay | Admitting: Internal Medicine

## 2015-04-03 ENCOUNTER — Encounter: Payer: Self-pay | Admitting: Internal Medicine

## 2015-04-03 VITALS — BP 90/60 | HR 66 | Ht 59.0 in | Wt 111.8 lb

## 2015-04-03 DIAGNOSIS — R079 Chest pain, unspecified: Secondary | ICD-10-CM

## 2015-04-03 DIAGNOSIS — R9431 Abnormal electrocardiogram [ECG] [EKG]: Secondary | ICD-10-CM

## 2015-04-03 DIAGNOSIS — I4581 Long QT syndrome: Secondary | ICD-10-CM

## 2015-04-03 DIAGNOSIS — R002 Palpitations: Secondary | ICD-10-CM

## 2015-04-03 NOTE — Patient Instructions (Signed)
Medication Instructions:  Your physician recommends that you continue on your current medications as directed. Please refer to the Current Medication list given to you today.  Labwork: None ordered  Testing/Procedures: Your physician has requested that you have an exercise tolerance test. For further information please visit [email or web address] Please also follow instruction sheet, as given.  Follow-Up: Your physician wants you to follow-up in: 1 year with Dr. Ladona Ridgel (if stress test is ok. If this test is abnormal, then someone will schedule you to be seen sooner.)  You will receive a reminder letter in the mail two months in advance. If you don't receive a letter, please call our office to schedule the follow-up appointment.  Any Other Special Instructions Will Be Listed Below (If Applicable). Thank you for choosing [facility]!!

## 2015-04-03 NOTE — Progress Notes (Signed)
HPI [name] returns for evaluation of prolongation of the QT interval and chest pain. The patient is a polysubstance abuser, who is on chronic methadone therapy. She has gradually reduced her methadone from 80 mg daily down to 23 mg daily. She has not had any relapses of heroin abuse. She still is smoking. She notes that at times she will feel palpitations. She has been dizzy on her beta blocker though this has gotten better. Sometimes she feels weak and at other times will experience chest pain, back pain or jaw pain. These are not associated with exertion. No frank syncope.  Allergies  Allergen Reactions  . Sulfa Antibiotics Nausea Only     Current Outpatient Prescriptions  Medication Sig Dispense Refill  . atenolol (TENORMIN) 25 MG tablet TAKE ONE TABLET BY MOUTH ONCE DAILY (Patient taking differently: Take 12.5 mg by mouth daily. 12.5 mg daily) 30 tablet 11  . METHADONE HCL PO Take 23 mLs by mouth daily.      No current facility-administered medications for this visit.     Past Medical History  Diagnosis Date  . 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 ? Needs INH.  Marland Kitchen Polysubstance abuse     a. 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  . Tobacco abuse   . Long Q-T syndrome     ROS:   All systems reviewed and negative except as noted in the HPI.   No past surgical history on file.   Family History  Problem Relation Age of Onset  . CAD      Grandmother's father  . Other      Mother was worked up remotely for ?parasympathetic nervous system problem but cardiac workup was reportedly unremarkable     Social History   Social History  . Marital Status: Single    Spouse Name: N/A  . Number of Children: N/A  . Years of Education: N/A   Occupational History  . Not on file.   Social History Main Topics  . Smoking status: Current Every Day Smoker -- 2.00 packs/day  . Smokeless tobacco: Not on file      Comment: Smoker since age 3  . Alcohol Use: No  . Drug Use: No     Comment: Remote history of opiates, heroin - clean for 8 years   . Sexual Activity: Not on file   Other Topics Concern  . Not on file   Social History Narrative     BP 90/60 mmHg  Pulse 66  Ht  (1.499 m)  Wt 111 lb 12.8 oz (50.712 kg)  BMI 22.57 kg/m2  SpO2 98%  Physical Exam:  stable appearing young woman, NAD HEENT: Unremarkable Neck:  6 cm JVD, no thyromegally Back:  No CVA tenderness Lungs:  Clear with no wheezes, rales, or rhonchi. HEART:  Regular rate rhythm, no murmurs, no rubs, no clicks Abd:  soft, positive bowel sounds, no organomegally, no rebound, no guarding Ext:  2 plus pulses, no edema, no cyanosis, no clubbing Skin:  No rashes no nodules, healed skin tracks appeared to present on the upper extremities. Neuro:  CN II through XII intact, motor grossly intact  EKG - sinus rhythm with no QT prolongation. The T waves are of small magnitude.   Assess/Plan:

## 2015-04-03 NOTE — Assessment & Plan Note (Signed)
Her QT prolongation has resolved. She will continue to try and reduce her dose of Medthadone. She denies and drug relapses.

## 2015-04-03 NOTE — Assessment & Plan Note (Signed)
We discussed the etiologies. She could have SVT or sinus tachycardia. Unlikely to have VT. Will follow.

## 2015-04-03 NOTE — Assessment & Plan Note (Signed)
The etiology of her chest pain is unclear. She has a long h/o tobacco abuse and remote cocaine abuse. Will ask her to undergo exercise stress testing to exclude occult CAD and rule out exercise induced arrhythmias.

## 2015-04-16 ENCOUNTER — Telehealth: Payer: Self-pay | Admitting: Internal Medicine

## 2015-04-16 NOTE — Telephone Encounter (Signed)
New message    Patient calling + positive D-mire. Is the blood clot causing this. Will it show on the upcoming stress test.

## 2015-04-16 NOTE — Telephone Encounter (Signed)
She is worried about the lab done in 08/2014.  I have asked her to keep appointment for stress test and if her leg continues to bother her with swelling to call her PCP and have them assess

## 2015-04-30 ENCOUNTER — Telehealth (HOSPITAL_COMMUNITY): Payer: Self-pay

## 2015-04-30 NOTE — Telephone Encounter (Signed)
Encounter complete. 

## 2015-05-01 ENCOUNTER — Encounter (HOSPITAL_COMMUNITY): Payer: Self-pay | Admitting: Emergency Medicine

## 2015-05-01 ENCOUNTER — Emergency Department (HOSPITAL_COMMUNITY)
Admission: EM | Admit: 2015-05-01 | Discharge: 2015-05-01 | Disposition: A | Discharge status: Home / Self Care | Payer: No Typology Code available for payment source | Source: Home / Self Care | Attending: Family Medicine | Admitting: Family Medicine

## 2015-05-01 DIAGNOSIS — K047 Periapical abscess without sinus: Secondary | ICD-10-CM

## 2015-05-01 MED ORDER — CLINDAMYCIN HCL 300 MG PO CAPS: 300.0000 mg | ORAL_CAPSULE | Freq: Three times a day (TID) | ORAL | Status: DC

## 2015-05-01 MED ORDER — FLUCONAZOLE 150 MG PO TABS: 150.0000 mg | ORAL_TABLET | Freq: Every day | ORAL | Status: DC

## 2015-05-01 MED ORDER — MELOXICAM 15 MG PO TABS: 7.5000 mg | ORAL_TABLET | Freq: Every day | ORAL | Status: DC

## 2015-05-01 NOTE — Discharge Instructions (Signed)
The dental abscess was drained in our clinic Please use the meloxicam for pain Please start the antibiotic Please use the diflucan if you develop a yeast infection

## 2015-05-01 NOTE — ED Notes (Signed)
C/o dental pain onset this am  Also reports CP onset 8 weeks associated w/SOB  A&O x4... No acute distress.

## 2015-05-01 NOTE — ED Provider Notes (Signed)
CSN: [id number]     Arrival date & time 05/01/15  1442 History   First MD Initiated Contact with Patient 05/01/15 1609     Chief Complaint  Patient presents with  . Dental Pain  . Chest Pain   (Consider location/radiation/quality/duration/timing/severity/associated sxs/prior Treatment) HPI  Dental pain and bulge in her gums versus abscess. Dental pain started approximately a weeks ago. Off and on. Constant over the last couple of days. Large swelling of her medial left lower mandibular region started 1 day ago. No purulent discharge noted. Pain is getting worse. Has not tried anything for the pain outside of her prescribed methadone. States that she feels like her left neck is somewhat stiff but this is been that way for 8 weeks. Denies any fevers, headache, confusion, shortness of breath, dysphagia, chest pain. Patient does have ongoing symptoms of prolonged QT and possible PVCs. She scheduled for a stress test on 05/05/2015. Her symptoms of prolonged QT and palpitations are at baseline. No active chest pain.    Past Medical History  Diagnosis Date  . 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 ? Needs INH.  Marland Kitchen. Polysubstance abuse     a. 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  . Tobacco abuse   . Long Q-T syndrome    History reviewed. No pertinent past surgical history. Family History  Problem Relation Age of Onset  . CAD      Grandmother's father  . Other      Mother was worked up remotely for ?parasympathetic nervous system problem but cardiac workup was reportedly unremarkable   Social History  Substance Use Topics  . Smoking status: Current Every Day Smoker -- 2.00 packs/day  . Smokeless tobacco: None     Comment: Smoker since age 34  . Alcohol Use: No   OB History    No data available     Review of Systems Per HPI with all other pertinent systems negative.   Allergies  Sulfa antibiotics  Home  Medications   Prior to Admission medications   Medication Sig Start Date End Date Taking? Authorizing Provider  atenolol (TENORMIN) 25 MG tablet TAKE ONE TABLET BY MOUTH ONCE DAILY Patient taking differently: Take 12.5 mg by mouth daily. 12.5 mg daily 03/27/14   Marinus MawGregg W Taylor, MD  clindamycin (CLEOCIN) 300 MG capsule Take 1 capsule (300 mg total) by mouth 3 (three) times daily. 05/01/15   Ozella Rocksavid J Carmelia Tiner, MD  fluconazole (DIFLUCAN) 150 MG tablet Take 1 tablet (150 mg total) by mouth daily. Repeat dose in 3 days 05/01/15   Ozella Rocksavid J March Steyer, MD  meloxicam (MOBIC) 15 MG tablet Take 0.5-1 tablets (7.5-15 mg total) by mouth daily. 05/01/15   Ozella Rocksavid J Gillis Boardley, MD  METHADONE HCL PO Take 23 mLs by mouth daily.     Historical Provider, MD   Meds Ordered and Administered this Visit  Medications - No data to display  BP 114/66 mmHg  Pulse 73  Temp(Src) 98.3 F (36.8 C) (Oral)  Resp 16  SpO2 100%  LMP 04/17/2015 No data found.   Physical Exam Physical Exam  Constitutional: oriented to person, place, and time. appears well-developed and well-nourished. No distress.  HENT:  Head: Normocephalic and atraumatic.  Left mandible second to third molar lingual side. Of fluctuance and tenderness. Approximately 0.5 cm circumferential. Eyes: EOMI. PERRL.  Neck: Normal range of motion.  Cardiovascular: RRR, no m/r/g, 2+ distal pulses,  Pulmonary/Chest:  Effort normal and breath sounds normal. No respiratory distress.  Abdominal: Soft. Bowel sounds are normal. NonTTP, no distension.  Musculoskeletal: Normal range of motion. Non ttp, no effusion.  Neurological: alert and oriented to person, place, and time.  Skin: Skin is warm. No rash noted. non diaphoretic.  Psychiatric: normal mood and affect. behavior is normal. Judgment and thought content normal.   ED Course  Procedures (including critical care time)  Labs Review Labs Reviewed - No data to display  Imaging Review No results found.   Visual  Acuity Review  Right Eye Distance:   Left Eye Distance:   Bilateral Distance:    Right Eye Near:   Left Eye Near:    Bilateral Near:         MDM   1. Dental abscess    Obtaining verbal consent gait due to was used to make a small linear incision and the abscess with scant purulent discharge appreciated. Poor dentition but remaining teeth intact. Start clindamycin and meloxicam. Pop with cardiology regarding prolonged QT and ongoing intermittent chest pain episodes. No active chest pain. No recent chest pain.    Ozella Rocks, MD 05/01/15 [phone]

## 2015-05-04 ENCOUNTER — Telehealth: Payer: Self-pay | Admitting: Internal Medicine

## 2015-05-04 NOTE — Telephone Encounter (Signed)
S[poke with patient and she is not going to take her Atenolol as she was instructed  She takes 6.25mg  bid  She will hold the dose in the morning

## 2015-05-04 NOTE — Telephone Encounter (Signed)
New message      Pt is having a stress test tomorrow.  Question about taking bp medication in the morning.

## 2015-05-05 ENCOUNTER — Ambulatory Visit (HOSPITAL_COMMUNITY)
Admission: RE | Admit: 2015-05-05 | Discharge: 2015-05-05 | Disposition: A | Discharge status: Home / Self Care | Payer: No Typology Code available for payment source | Source: Ambulatory Visit | Attending: Cardiovascular Disease | Admitting: Cardiovascular Disease

## 2015-05-05 DIAGNOSIS — R079 Chest pain, unspecified: Secondary | ICD-10-CM | POA: Insufficient documentation

## 2015-05-05 LAB — EXERCISE TOLERANCE TEST
CHL CUP MPHR: 188 {beats}/min
CHL CUP RESTING HR STRESS: 63 {beats}/min
CHL CUP STRESS STAGE 1 SBP: 102 mmHg
CHL CUP STRESS STAGE 2 GRADE: 0 %
CHL CUP STRESS STAGE 2 SPEED: 1 mph
CHL CUP STRESS STAGE 3 GRADE: 0 %
CHL CUP STRESS STAGE 4 HR: 99 {beats}/min
CHL CUP STRESS STAGE 5 GRADE: 12 %
CHL CUP STRESS STAGE 5 SPEED: 2.5 mph
CHL CUP STRESS STAGE 6 GRADE: 14 %
CHL CUP STRESS STAGE 6 HR: 129 {beats}/min
CHL CUP STRESS STAGE 6 SBP: 120 mmHg
CHL CUP STRESS STAGE 6 SPEED: 3.4 mph
CHL CUP STRESS STAGE 7 DBP: 87 mmHg
CHL CUP STRESS STAGE 7 GRADE: 16 %
CHL CUP STRESS STAGE 7 HR: 166 {beats}/min
CHL CUP STRESS STAGE 7 SBP: 148 mmHg
CHL CUP STRESS STAGE 7 SPEED: 4.2 mph
CHL CUP STRESS STAGE 8 HR: 133 {beats}/min
CHL CUP STRESS STAGE 9 HR: 89 {beats}/min
CHL RATE OF PERCEIVED EXERTION: 16
CSEPED: 11 min
CSEPEDS: 0 s
CSEPEW: 13.4 METS
CSEPHR: 88 %
CSEPPBP: 148 mmHg
CSEPPHR: 166 {beats}/min
CSEPPMHR: 88 %
Stage 1 DBP: 79 mmHg
Stage 1 Grade: 0 %
Stage 1 HR: 73 {beats}/min
Stage 1 Speed: 0 mph
Stage 2 HR: 72 {beats}/min
Stage 3 HR: 72 {beats}/min
Stage 3 Speed: 1 mph
Stage 4 DBP: 65 mmHg
Stage 4 Grade: 10 %
Stage 4 SBP: 136 mmHg
Stage 4 Speed: 1.7 mph
Stage 5 DBP: 66 mmHg
Stage 5 HR: 105 {beats}/min
Stage 5 SBP: 116 mmHg
Stage 6 DBP: 65 mmHg
Stage 8 DBP: 47 mmHg
Stage 8 Grade: 0 %
Stage 8 SBP: 130 mmHg
Stage 8 Speed: 0 mph
Stage 9 DBP: 60 mmHg
Stage 9 Grade: 0 %
Stage 9 SBP: 108 mmHg
Stage 9 Speed: 0 mph

## 2015-05-06 ENCOUNTER — Encounter (HOSPITAL_COMMUNITY): Payer: Self-pay | Admitting: Emergency Medicine

## 2015-05-06 ENCOUNTER — Emergency Department (HOSPITAL_COMMUNITY)
Admission: EM | Admit: 2015-05-06 | Discharge: 2015-05-06 | Disposition: A | Discharge status: Home / Self Care | Payer: No Typology Code available for payment source | Source: Home / Self Care | Attending: Emergency Medicine | Admitting: Emergency Medicine

## 2015-05-06 DIAGNOSIS — T50905A Adverse effect of unspecified drugs, medicaments and biological substances, initial encounter: Secondary | ICD-10-CM

## 2015-05-06 DIAGNOSIS — T887XXA Unspecified adverse effect of drug or medicament, initial encounter: Secondary | ICD-10-CM

## 2015-05-06 NOTE — ED Notes (Signed)
[facility] with a complaint of a possible medication reaction. [facility] with an abscessed tooth and was prescribed clindamycin. She stated that she took 4 days worth and started having PVS's and SOB that continued today after stopping the antibiotic on May 05, 2015.

## 2015-05-06 NOTE — ED Provider Notes (Signed)
CSN: [id number]     Arrival date & time 05/06/15  1504 History   First MD Initiated Contact with Patient 05/06/15 1647     Chief Complaint  Patient presents with  . Medication Reaction   (Consider location/radiation/quality/duration/timing/severity/associated sxs/prior Treatment) HPI  She is a 34 year old woman here for evaluation of medication reaction. She was seen here 4 days ago for a dental abscess. Abscess was drained and she was started on clindamycin. She states that she took clindamycin 3 times a day for the first 2 days, but then yesterday and the day before she was only able to take it twice a day. She reported chest pain, increased palpitations, shortness of breath, and dizziness on the clindamycin. Her last dose was last night at 8 PM. She states today she has slowly been feeling better. Currently she denies any shortness of breath or dizziness. No palpitations at this time. She does still report some left anterior and side chest pain.  She reports a long history of chest and shoulder pain. She is [facility].  Past Medical History  Diagnosis Date  . 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 ? Needs INH.  Marland Kitchen. Polysubstance abuse     a. 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  . Tobacco abuse   . Long Q-T syndrome    History reviewed. No pertinent past surgical history. Family History  Problem Relation Age of Onset  . CAD      Grandmother's father  . Other      Mother was worked up remotely for ?parasympathetic nervous system problem but cardiac workup was reportedly unremarkable   Social History  Substance Use Topics  . Smoking status: Current Every Day Smoker -- 2.00 packs/day  . Smokeless tobacco: None     Comment: Smoker since age 34  . Alcohol Use: No   OB History    No data available     Review of Systems As in history of present illness Allergies   Clindamycin/lincomycin and Sulfa antibiotics  Home Medications   Prior to Admission medications   Medication Sig Start Date End Date Taking? Authorizing Provider  atenolol (TENORMIN) 25 MG tablet TAKE ONE TABLET BY MOUTH ONCE DAILY Patient taking differently: Take 12.5 mg by mouth daily. 12.5 mg daily 03/27/14   Marinus MawGregg W Taylor, MD  fluconazole (DIFLUCAN) 150 MG tablet Take 1 tablet (150 mg total) by mouth daily. Repeat dose in 3 days 05/01/15   Ozella Rocksavid J Merrell, MD  meloxicam (MOBIC) 15 MG tablet Take 0.5-1 tablets (7.5-15 mg total) by mouth daily. 05/01/15   Ozella Rocksavid J Merrell, MD  METHADONE HCL PO Take 23 mLs by mouth daily.     Historical Provider, MD   Meds Ordered and Administered this Visit  Medications - No data to display  BP 104/71 mmHg  Pulse 65  Temp(Src) 98.7 F (37.1 C) (Oral)  Resp 18  SpO2 100%  LMP 04/17/2015 No data found.   Physical Exam  Constitutional: She is oriented to person, place, and time. She appears well-developed and well-nourished. No distress.  HENT:  No evidence of abscess or erythema in the mouth.  Neck: Neck supple.  Cardiovascular: Normal rate, regular rhythm and normal heart sounds.   No murmur heard. Pulmonary/Chest: Effort normal and breath sounds normal. No respiratory distress. She has no wheezes. She has no rales.  Neurological: She is alert and  oriented to person, place, and time.    ED Course  Procedures (including critical care time) ED ECG REPORT   Date: 05/06/2015  Rate: 61  Rhythm: normal sinus rhythm  QRS Axis: normal  Intervals: normal  ST/T Wave abnormalities: normal  Conduction Disutrbances:none  Narrative Interpretation: normal ekg; poor r wave progression  Old EKG Reviewed: unchanged  I have personally reviewed the EKG tracing and agree with the computerized printout as noted.   Labs Review Labs Reviewed - No data to display  Imaging Review No results found.    MDM   1. Medication reaction, initial  encounter    History is most consistent with reaction to clindamycin. I added this to her allergy list. Provided reassurance. To change antibiotics as her oral exam is normal. Follow-up as needed.    Charm Rings, MD 05/06/15 [phone]

## 2015-05-06 NOTE — Discharge Instructions (Signed)
You had a reaction to clindamycin. I have added this medicine to your allergy list. Your mouth looks like the infection has resolved. You do not need any additional antibiotics. I do not see any signs of a blood clot in your legs. Do hot baths or heating pads to help relax your muscles. Icy hot, massage, and gentle stretching will help with the muscle tightness.

## 2015-06-16 ENCOUNTER — Telehealth: Payer: Self-pay | Admitting: Internal Medicine

## 2015-06-16 ENCOUNTER — Other Ambulatory Visit: Payer: Self-pay | Admitting: Internal Medicine

## 2015-06-16 MED ORDER — ATENOLOL 25 MG PO TABS: ORAL_TABLET | ORAL | Status: DC

## 2015-06-16 NOTE — Telephone Encounter (Signed)
New message       *STAT* If patient is at the pharmacy, call can be transferred to refill team.   1. Which medications need to be refilled? (please list name of each medication and dose if known) atenolol 25mg  2. Which pharmacy/location (including street and city if local pharmacy) is medication to be sent to? [facility] [facility]. Do they need a 30 day or 90 day supply? 90 day Pt states she is out of medication

## 2015-07-02 ENCOUNTER — Other Ambulatory Visit: Payer: Self-pay | Admitting: Gastroenterology

## 2015-07-02 DIAGNOSIS — R1310 Dysphagia, unspecified: Secondary | ICD-10-CM

## 2015-07-08 ENCOUNTER — Ambulatory Visit
Admission: RE | Admit: 2015-07-08 | Discharge: 2015-07-08 | Disposition: A | Discharge status: Home / Self Care | Payer: No Typology Code available for payment source | Source: Ambulatory Visit | Attending: Gastroenterology | Admitting: Gastroenterology

## 2015-07-08 DIAGNOSIS — R1310 Dysphagia, unspecified: Secondary | ICD-10-CM

## 2015-08-27 ENCOUNTER — Ambulatory Visit (INDEPENDENT_AMBULATORY_CARE_PROVIDER_SITE_OTHER): Payer: Self-pay | Admitting: Licensed Clinical Social Worker

## 2015-08-27 DIAGNOSIS — F439 Reaction to severe stress, unspecified: Secondary | ICD-10-CM
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
## 2015-09-03 ENCOUNTER — Other Ambulatory Visit: Payer: No Typology Code available for payment source | Admitting: Licensed Clinical Social Worker

## 2015-09-10 ENCOUNTER — Ambulatory Visit (INDEPENDENT_AMBULATORY_CARE_PROVIDER_SITE_OTHER): Payer: Self-pay | Admitting: Licensed Clinical Social Worker

## 2015-09-10 DIAGNOSIS — Z1330 Encounter for screening examination for mental health and behavioral disorders, unspecified: Secondary | ICD-10-CM

## 2015-09-10 DIAGNOSIS — Z1389 Encounter for screening for other disorder: Secondary | ICD-10-CM

## 2015-09-10 DIAGNOSIS — Z1342 Encounter for screening for global developmental delays (milestones): Principal | ICD-10-CM

## 2015-09-10 NOTE — Progress Notes (Signed)
   THERAPY PROGRESS NOTE  Session Time: 60 minutes  Participation Level: Active  Behavioral Response: NeatAlertEuthymic  Type of Therapy: Individual Therapy  Treatment Goals addressed: Diagnosis: Pending  Interventions: Supportive  Summary: [name] is a 35 y.o. female who presents with euthymic mood and appropriate affect. [name] reported that her relationship is still unsteady with her boyfriend. She reported that she felt like he is a good guy, but doesn't understand how he acts sometimes. [name] reported that she feels they are not a normal couple and that he might have a mental disorder. [name] reported she did not know of she wanted to stay in the relationship with him or not. [name] reported that she had relapsed 3 times in her lifetime. [name] reported that she is currently seeing a therapist at the clinic she goes to twice a month. [name] reported that her session with the other therapist was not good because she felt like she had to be in the defense about herself. [name] reported that she enjoyed seeing me but always feels flustered when seeing the other therapist. [name] reported more about her family and talked more about the trauma with them. [name] reported that she felt like she is getting her life together. [name] said that being smart and resilient is some of her best qualities and this is how she had lasted. [name] reported that her great-grandma has been a great support to her and when she was younger she was able to depend on her.   Suicidal/Homicidal: Nowithout intent/plan  Therapist Response: Social Work Tax inspector (SWI) greeted [name] and asked her how things were going. SWI used the assessment to guide session but was not able to finish due to time constraints. SWI used empathy when Thedora talked about her relationship with her boyfriend. SWI used open ended questions when asking about [name] and her boyfriend's relationship. SWI used active listening when [name] was  talking about her past.   Plan: Return again in 1 week.  Diagnosis: Axis I: Pending    Axis II: No diagnosis    [name], Student-SW 09/10/2015

## 2015-09-17 ENCOUNTER — Ambulatory Visit (INDEPENDENT_AMBULATORY_CARE_PROVIDER_SITE_OTHER): Payer: Self-pay | Admitting: Licensed Clinical Social Worker

## 2015-09-17 DIAGNOSIS — Z1389 Encounter for screening for other disorder: Secondary | ICD-10-CM

## 2015-09-17 DIAGNOSIS — Z1330 Encounter for screening examination for mental health and behavioral disorders, unspecified: Secondary | ICD-10-CM

## 2015-09-17 DIAGNOSIS — Z1342 Encounter for screening for global developmental delays (milestones): Principal | ICD-10-CM
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
## 2015-09-24 ENCOUNTER — Other Ambulatory Visit: Payer: No Typology Code available for payment source | Admitting: Licensed Clinical Social Worker

## 2015-10-08 ENCOUNTER — Ambulatory Visit (INDEPENDENT_AMBULATORY_CARE_PROVIDER_SITE_OTHER): Payer: Self-pay | Admitting: Licensed Clinical Social Worker

## 2015-10-08 DIAGNOSIS — F1910 Other psychoactive substance abuse, uncomplicated: Secondary | ICD-10-CM

## 2015-10-08 DIAGNOSIS — F1911 Other psychoactive substance abuse, in remission: Secondary | ICD-10-CM
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
## 2015-10-15 ENCOUNTER — Ambulatory Visit (INDEPENDENT_AMBULATORY_CARE_PROVIDER_SITE_OTHER): Payer: Self-pay | Admitting: Licensed Clinical Social Worker

## 2015-10-15 DIAGNOSIS — F1910 Other psychoactive substance abuse, uncomplicated: Secondary | ICD-10-CM

## 2015-10-15 DIAGNOSIS — F1911 Other psychoactive substance abuse, in remission: Secondary | ICD-10-CM
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
## 2015-10-22 ENCOUNTER — Other Ambulatory Visit: Payer: No Typology Code available for payment source | Admitting: Licensed Clinical Social Worker

## 2015-10-29 ENCOUNTER — Ambulatory Visit (INDEPENDENT_AMBULATORY_CARE_PROVIDER_SITE_OTHER): Payer: Self-pay | Admitting: Licensed Clinical Social Worker

## 2015-10-29 DIAGNOSIS — F1910 Other psychoactive substance abuse, uncomplicated: Secondary | ICD-10-CM

## 2015-10-29 DIAGNOSIS — F1911 Other psychoactive substance abuse, in remission: Secondary | ICD-10-CM
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
## 2015-11-05 ENCOUNTER — Ambulatory Visit (INDEPENDENT_AMBULATORY_CARE_PROVIDER_SITE_OTHER): Payer: Self-pay | Admitting: Licensed Clinical Social Worker

## 2015-11-05 DIAGNOSIS — F1910 Other psychoactive substance abuse, uncomplicated: Secondary | ICD-10-CM

## 2015-11-05 DIAGNOSIS — F1911 Other psychoactive substance abuse, in remission: Secondary | ICD-10-CM
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
## 2015-11-12 ENCOUNTER — Ambulatory Visit (INDEPENDENT_AMBULATORY_CARE_PROVIDER_SITE_OTHER): Payer: Self-pay | Admitting: Licensed Clinical Social Worker

## 2015-11-12 DIAGNOSIS — F1911 Other psychoactive substance abuse, in remission: Secondary | ICD-10-CM

## 2015-11-12 DIAGNOSIS — F1910 Other psychoactive substance abuse, uncomplicated: Secondary | ICD-10-CM
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
## 2015-11-18 ENCOUNTER — Ambulatory Visit (INDEPENDENT_AMBULATORY_CARE_PROVIDER_SITE_OTHER): Payer: Self-pay | Admitting: Licensed Clinical Social Worker

## 2015-11-18 DIAGNOSIS — F1911 Other psychoactive substance abuse, in remission: Secondary | ICD-10-CM

## 2015-11-18 DIAGNOSIS — F1910 Other psychoactive substance abuse, uncomplicated: Secondary | ICD-10-CM
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
## 2015-11-25 ENCOUNTER — Ambulatory Visit (HOSPITAL_COMMUNITY): Admit: 2015-11-25 | Payer: Self-pay | Admitting: Gastroenterology

## 2015-11-25 ENCOUNTER — Encounter (HOSPITAL_COMMUNITY): Payer: Self-pay

## 2015-11-25 SURGERY — COLONOSCOPY WITH PROPOFOL
Anesthesia: Monitor Anesthesia Care

## 2015-12-16 ENCOUNTER — Other Ambulatory Visit: Payer: Self-pay | Admitting: Gastroenterology

## 2015-12-16 DIAGNOSIS — R1084 Generalized abdominal pain: Secondary | ICD-10-CM

## 2015-12-18 ENCOUNTER — Other Ambulatory Visit: Payer: Self-pay

## 2016-01-08 ENCOUNTER — Inpatient Hospital Stay: Admission: RE | Admit: 2016-01-08 | Payer: Self-pay | Source: Ambulatory Visit

## 2016-02-12 ENCOUNTER — Other Ambulatory Visit: Payer: Self-pay | Admitting: Internal Medicine

## 2016-02-12 NOTE — Telephone Encounter (Signed)
°*  STAT* If patient is at the pharmacy, call can be transferred to refill team.   1. Which medications need to be refilled? (please list name of each medication and dose if known) Atenolol  2. Which pharmacy/location (including street and city if local pharmacy) is medication to be sent to?[facility][phone] 3. Do they need a 30 day or 90 day supply? 30 and refills

## 2016-02-12 NOTE — Telephone Encounter (Signed)
Follow Up:    Pt wants her refill that she just called about to be 90 days instead of 30 days please.She says she is going to pick it up in 30 minutes.

## 2016-02-12 NOTE — Telephone Encounter (Signed)
Pt needs appt before receiving 90 day supply.

## 2016-02-12 NOTE — Telephone Encounter (Signed)
Pt's Rx has been sent to pt's pharmacy as requested. Confirmation received.  °

## 2016-02-25 ENCOUNTER — Telehealth: Payer: Self-pay | Admitting: Internal Medicine

## 2016-02-25 NOTE — Telephone Encounter (Signed)
NEW MESSAGE     Pt c/o Shortness Of Breath: STAT if SOB developed within the last 24 hours or pt is noticeably SOB on the phone  1. Are you currently SOB (can you hear that pt is SOB on the phone)? no 2. How long have you been experiencing SOB? 2 weeks  3. Are you SOB when sitting or when up moving around? both  4. Are you currently experiencing any other symptoms? Heart Racing, fatigue, muscle tightness in legs and back   Pt verbalized that she don't know if it is from the medications

## 2016-02-25 NOTE — Telephone Encounter (Signed)
Attempted to contact pt again at the phone # left.  Phone rang multiples times, NA.

## 2016-02-25 NOTE — Telephone Encounter (Signed)
Attempted to contact pt at phone number listed.  The phone rand and sounded as if someone picked up but no one responded.  Will attempt to c/b.

## 2016-02-26 NOTE — Telephone Encounter (Signed)
Patient scheduled with Dr. Ladona Ridgelaylor on 8/16 at 4:30 by Glynda JaegerMelissa Tatum.  I notified patient who verbalized understanding and agreement and thanked me for the call.

## 2016-02-26 NOTE — Telephone Encounter (Signed)
Spoke with patient who states she is having symptoms that concern her and has been evaluated by PCP who felt she should f/u with cardiology.  She last saw Dr. Ladona Ridgelaylor 9/16 and he advised 1 yr f/u.  She states her symptoms are progressing over the last year and are better after rest.  Complains of SOB with exertion, pain in left rib cage (constant) and worse with exertion, and fatigue.  States her heart racing and fluttering wakes her up early in the morning. She states her palpitations are constant with SOB frequently accompanying them.  Complains of generalized muscle aches and "nerve" pain in left arm and legs.  States was last evaluated by PCP 2 months ago who advised she f/u with cardiology.  She states she is currently taking 1/4 to 1/2 tab of Atenolol 25 mg daily.  I advised that I will check schedule for next available appointment with Dr. Ladona Ridgelaylor or APP and call her back.  She verbalized understanding and agreement.

## 2016-03-02 ENCOUNTER — Encounter: Payer: Self-pay | Admitting: Internal Medicine

## 2016-03-02 ENCOUNTER — Ambulatory Visit (INDEPENDENT_AMBULATORY_CARE_PROVIDER_SITE_OTHER): Payer: Self-pay | Admitting: Internal Medicine

## 2016-03-02 VITALS — BP 102/68 | HR 76 | Ht 59.0 in | Wt 118.0 lb

## 2016-03-02 DIAGNOSIS — Z720 Tobacco use: Secondary | ICD-10-CM

## 2016-03-02 DIAGNOSIS — R002 Palpitations: Secondary | ICD-10-CM

## 2016-03-02 NOTE — Patient Instructions (Addendum)
Medication Instructions: Your physician has recommended you make the following change in your medication:  REDUCE Atenolol to 1/2 tablet every other day for 1 week then 1/2 tablet every third day for a week then STOP  Labwork: NONE ORDERED  Procedures/Testing: NONE ORDERED  Follow-Up:  Your physician recommends that you schedule a follow-up appointment in: 4 weeks Nurse visit with an EKG  Your physician wants you to follow-up in: 1 year with Dr.Taylor You will receive a reminder letter in the mail two months in advance. If you don't receive a letter, please call our office to schedule the follow-up appointment.      Any Additional Special Instructions Will Be Listed Below (If Applicable).     If you need a refill on your cardiac medications before your next appointment, please call your pharmacy.

## 2016-03-02 NOTE — Progress Notes (Signed)
HPI [name] returns for ongoing evaluation of prolongation of the QT interval and chest pain. The patient is a polysubstance abuser, who is on chronic methadone therapy. She has not had a heroin relapse in 4 years. She has reduced her methadone from 80 mg daily down to 23 mg daily. She has not had any relapses of heroin abuse in the past year. She still is smoking. She notes that at times she will feel palpitations. She has been fatigued, and weak and som bowel changes but no diarrhea. Sometimes she feels weak and at other times will experience chest pain, back pain or jaw pain. These are not associated with exertion. No frank syncope.   Allergies  Allergen Reactions  . Clindamycin/Lincomycin     palpitations  . Sulfa Antibiotics Nausea Only     Current Outpatient Prescriptions  Medication Sig Dispense Refill  . atenolol (TENORMIN) 25 MG tablet Take 1 tablet (25 mg total) by mouth daily. 30 tablet 2  . METHADONE HCL PO Take 23 mLs by mouth daily.      No current facility-administered medications for this visit.      Past Medical History:  Diagnosis Date  . Long Q-T syndrome   . Polysubstance abuse    a. 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  Marland Kitchen. 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 ? Needs INH.  . Tobacco abuse     ROS:   All systems reviewed and negative except as noted in the HPI.   No past surgical history on file.   Family History  Problem Relation Age of Onset  . CAD      Grandmother's father  . Other      Mother was worked up remotely for ?parasympathetic nervous system problem but cardiac workup was reportedly unremarkable     Social History   Social History  . Marital status: Single    Spouse name: N/A  . Number of children: N/A  . Years of education: N/A   Occupational History  . Not on file.   Social History Main Topics  . Smoking status: Current Every Day Smoker   Packs/day: 2.00  . Smokeless tobacco: Not on file     Comment: Smoker since age 35  . Alcohol use No  . Drug use: No     Comment: Remote history of opiates, heroin - clean for 8 years   . Sexual activity: Not on file   Other Topics Concern  . Not on file   Social History Narrative  . No narrative on file     BP 102/68   Pulse 76   Ht 4\' 11"  (1.499 m)   Wt 118 lb (53.5 kg)   BMI 23.83 kg/m   Physical Exam:  stable appearing young woman, NAD HEENT: Unremarkable except for poor dentition. Neck:  6 cm JVD, no thyromegally Back:  No CVA tenderness Lungs:  Clear with no wheezes, rales, or rhonchi. HEART:  Regular rate rhythm, no murmurs, no rubs, no clicks Abd:  soft, positive bowel sounds, no organomegally, no rebound, no guarding Ext:  2 plus pulses, no edema, no cyanosis, no clubbing Skin:  No rashes no nodules, healed skin tracks appeared to present on the upper extremities. Neuro:  CN II through XII intact, motor grossly intact  EKG - sinus rhythm with no QT prolongation. Poor R wave progression.  Assess/Plan: 1. Methadone induced long QT - she is asymptomatic and  QT interval is within normal limits. She will undergo watchful waiting. She will avoid any QT prolonging drugs. 2. Fatigue and malaise - I wonder whether her Atenolol could be playing a role. I have asked her to wean off of her atenolol. If she feels better off of the medication then we will ask her to return for an ECG. If QT is ok, then she may remain off of the beta blocker. 3. Substance abuse - she denies any use. I encouraged her to remain clean. 4. Tobacco use - long term, I hope she can stop smoking but for now will hold off on strongly encouraging her to stop smoking as we want to do all we can to prevent another relapse.  [name],M.D.

## 2016-03-23 ENCOUNTER — Emergency Department (HOSPITAL_COMMUNITY)
Admission: EM | Admit: 2016-03-23 | Discharge: 2016-03-23 | Disposition: A | Discharge status: Home / Self Care | Payer: Self-pay | Attending: Emergency Medicine | Admitting: Emergency Medicine

## 2016-03-23 ENCOUNTER — Emergency Department (HOSPITAL_COMMUNITY): Payer: Self-pay

## 2016-03-23 ENCOUNTER — Encounter (HOSPITAL_COMMUNITY): Payer: Self-pay | Admitting: Emergency Medicine

## 2016-03-23 DIAGNOSIS — R1012 Left upper quadrant pain: Secondary | ICD-10-CM | POA: Insufficient documentation

## 2016-03-23 DIAGNOSIS — R195 Other fecal abnormalities: Secondary | ICD-10-CM | POA: Insufficient documentation

## 2016-03-23 DIAGNOSIS — R197 Diarrhea, unspecified: Secondary | ICD-10-CM | POA: Insufficient documentation

## 2016-03-23 DIAGNOSIS — F17200 Nicotine dependence, unspecified, uncomplicated: Secondary | ICD-10-CM | POA: Insufficient documentation

## 2016-03-23 DIAGNOSIS — R109 Unspecified abdominal pain: Secondary | ICD-10-CM

## 2016-03-23 LAB — CBC WITH DIFFERENTIAL/PLATELET
Basophils Absolute: 0 10*3/uL (ref 0.0–0.1)
Basophils Relative: 0 %
EOS ABS: 0.2 10*3/uL (ref 0.0–0.7)
EOS PCT: 3 %
HCT: 44.1 % (ref 36.0–46.0)
Hemoglobin: 14.8 g/dL (ref 12.0–15.0)
LYMPHS ABS: 1.8 10*3/uL (ref 0.7–4.0)
LYMPHS PCT: 36 %
MCH: 31.5 pg (ref 26.0–34.0)
MCHC: 33.6 g/dL (ref 30.0–36.0)
MCV: 93.8 fL (ref 78.0–100.0)
MONO ABS: 0.5 10*3/uL (ref 0.1–1.0)
MONOS PCT: 10 %
Neutro Abs: 2.7 10*3/uL (ref 1.7–7.7)
Neutrophils Relative %: 51 %
PLATELETS: 239 10*3/uL (ref 150–400)
RBC: 4.7 MIL/uL (ref 3.87–5.11)
RDW: 12 % (ref 11.5–15.5)
WBC: 5.2 10*3/uL (ref 4.0–10.5)

## 2016-03-23 LAB — POC OCCULT BLOOD, ED: FECAL OCCULT BLD: POSITIVE — AB

## 2016-03-23 LAB — COMPREHENSIVE METABOLIC PANEL
ALK PHOS: 40 U/L (ref 38–126)
ALT: 15 U/L (ref 14–54)
AST: 19 U/L (ref 15–41)
Albumin: 3.9 g/dL (ref 3.5–5.0)
Anion gap: 7 (ref 5–15)
CALCIUM: 8.8 mg/dL — AB (ref 8.9–10.3)
CHLORIDE: 106 mmol/L (ref 101–111)
CO2: 25 mmol/L (ref 22–32)
CREATININE: 0.69 mg/dL (ref 0.44–1.00)
GFR calc non Af Amer: >60 mL/min (ref >60)
GLUCOSE: 96 mg/dL (ref 65–99)
Potassium: 4.1 mmol/L (ref 3.5–5.1)
SODIUM: 138 mmol/L (ref 135–145)
Total Bilirubin: 0.4 mg/dL (ref 0.3–1.2)
Total Protein: 6.5 g/dL (ref 6.5–8.1)

## 2016-03-23 LAB — URINALYSIS, ROUTINE W REFLEX MICROSCOPIC
BILIRUBIN URINE: NEGATIVE
Glucose, UA: NEGATIVE mg/dL
Hgb urine dipstick: NEGATIVE
Ketones, ur: NEGATIVE mg/dL
Leukocytes, UA: NEGATIVE
NITRITE: NEGATIVE
PH: 7.5 (ref 5.0–8.0)
Protein, ur: NEGATIVE mg/dL
SPECIFIC GRAVITY, URINE: 1.007 (ref 1.005–1.030)

## 2016-03-23 LAB — PREGNANCY, URINE: PREG TEST UR: NEGATIVE

## 2016-03-23 LAB — LIPASE, BLOOD: Lipase: 18 U/L (ref 11–51)

## 2016-03-23 MED ORDER — IOPAMIDOL (ISOVUE-300) INJECTION 61%
INTRAVENOUS | Status: AC
  Filled 2016-03-23: qty 100

## 2016-03-23 MED ORDER — BARIUM SULFATE 2.1 % PO SUSP
ORAL | Status: AC
  Filled 2016-03-23: qty 2

## 2016-03-23 NOTE — ED Notes (Signed)
Pt ambulatory, dc home

## 2016-03-23 NOTE — ED Provider Notes (Signed)
[facility]-EMERGENCY DEPT Provider Note   CSN: [id number] Arrival date & time: 03/23/16  0917     History   Chief Complaint Chief Complaint  Patient presents with  . Abdominal Pain  . Rectal Bleeding    HPI [name] is a 35 y.o. female.  [name] is a 35 y.o. female with h/o polysubstance abuse, prolonged QT, tobacco abuse, and positive TB test presents to ED with complaint of left sided abdominal pain. Patient states she has had intermittent abdominal pain for the last year and a half that has progressively worsened. Patient states initially abdominal pain only occurred before a bowel movement and was relieved with defecation. Now, patient experiences intermittent abdominal pain throughout the day. She describes the pain as a cramping "inflammed" sensation. She states her stools are thin and loose in nature. She has associated mucous in stools, bright red blood per rectum, nausea, lightheadedness, and generalized fatigue. She denies fever, vomiting, pain with defecation, rectal pain, chest pain, cough, syncope, dysuria, hematuria, pelvic pain, vaginal pain, vaginal discharge, vaginal bleeding, rashes. She is sexually active with one female partner, fiance. She has not seen a GI specialist secondary to financial concerns. Family h/o colon cancer - grandmother in her 8950's.       Past Medical History:  Diagnosis Date  . Long Q-T syndrome   . Polysubstance abuse    a. 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  Marland Kitchen. 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 ? Needs INH.  . Tobacco abuse     Patient Active Problem List   Diagnosis Date Noted  . Tobacco abuse 03/02/2016  . Chest pain at rest 04/03/2015  . Palpitations 08/21/2013  . Prolonged Q-T interval on ECG 01/28/2013    History reviewed. No pertinent surgical history.  OB History    No data available       Home Medications    Prior to Admission  medications   Medication Sig Start Date End Date Taking? Authorizing Provider  atenolol (TENORMIN) 25 MG tablet Take 1 tablet (25 mg total) by mouth daily. Patient taking differently: Take 6.25 mg by mouth 2 (two) times daily.  02/12/16  Yes Marinus MawGregg W Taylor, MD  METHADONE HCL PO Take 20 mLs by mouth daily.    Yes Historical Provider, MD    Family History Family History  Problem Relation Age of Onset  . CAD      Grandmother's father  . Other      Mother was worked up remotely for ?parasympathetic nervous system problem but cardiac workup was reportedly unremarkable    Social History Social History  Substance Use Topics  . Smoking status: Current Every Day Smoker    Packs/day: 2.00  . Smokeless tobacco: Never Used     Comment: Smoker since age 35  . Alcohol use No     Allergies   Iodinated diagnostic agents; Clindamycin/lincomycin; and Sulfa antibiotics   Review of Systems Review of Systems  Constitutional: Positive for fatigue. Negative for chills, diaphoresis and fever.  HENT: Negative for trouble swallowing.   Eyes: Negative for visual disturbance.  Respiratory: Negative for cough.   Cardiovascular: Negative for chest pain.  Gastrointestinal: Positive for abdominal pain, blood in stool, diarrhea and nausea. Negative for vomiting.  Genitourinary: Negative for dysuria, hematuria, pelvic pain, vaginal bleeding, vaginal discharge and vaginal pain.  Musculoskeletal: Positive for arthralgias and myalgias. Negative for neck pain.  Skin: Negative for rash.  Neurological: Positive for light-headedness. Negative for syncope.     Physical Exam Updated Vital Signs BP 114/65 (BP Location: Right Arm)   Pulse 67   Temp 98.3 F (36.8 C) (Oral)   Resp 18   SpO2 97%   Physical Exam  Constitutional: She appears well-developed and well-nourished. No distress.  HENT:  Head: Normocephalic and atraumatic.  Mouth/Throat: Oropharynx is clear and moist. No oropharyngeal exudate.    Eyes: Conjunctivae and EOM are normal. Pupils are equal, round, and reactive to light. Right eye exhibits no discharge. Left eye exhibits no discharge. No scleral icterus.  Neck: Normal range of motion and phonation normal. Neck supple. No neck rigidity. Normal range of motion present.  Cardiovascular: Normal rate, regular rhythm, normal heart sounds and intact distal pulses.   No murmur heard. Pulmonary/Chest: Effort normal and breath sounds normal. No stridor. No respiratory distress. She has no wheezes. She has no rales.  Abdominal: Soft. Bowel sounds are normal. She exhibits no distension. There is tenderness in the suprapubic area, left upper quadrant and left lower quadrant. There is no rigidity, no rebound, no guarding and no CVA tenderness.  Musculoskeletal: Normal range of motion.  Lymphadenopathy:    She has no cervical adenopathy.  Neurological: She is alert. She is not disoriented. Coordination and gait normal. GCS eye subscore is 4. GCS verbal subscore is 5. GCS motor subscore is 6.  Skin: Skin is warm and dry. She is not diaphoretic.  Psychiatric: She has a normal mood and affect. Her behavior is normal.     ED Treatments / Results  Labs (all labs ordered are listed, but only abnormal results are displayed) Labs Reviewed  COMPREHENSIVE METABOLIC PANEL - Abnormal; Notable for the following:       Result Value   BUN <5 (*)    Calcium 8.8 (*)    All other components within normal limits  POC OCCULT BLOOD, ED - Abnormal; Notable for the following:    Fecal Occult Bld POSITIVE (*)    All other components within normal limits  URINALYSIS, ROUTINE W REFLEX MICROSCOPIC (NOT AT [facility])  PREGNANCY, URINE  LIPASE, BLOOD  CBC WITH DIFFERENTIAL/PLATELET    EKG  EKG Interpretation None       Radiology CLINICAL DATA: Left lower quadrant pain for a year. Acute pain a  few days ago.    EXAM:  CT ABDOMEN AND PELVIS WITHOUT CONTRAST    TECHNIQUE:  Multidetector CT  imaging of the abdomen and pelvis was performed  following the standard protocol without IV contrast.    COMPARISON: None.    FINDINGS:  Lower chest: Lung bases are clear. No effusions. Heart is normal  size.    Hepatobiliary: No focal hepatic abnormality. Gallbladder  unremarkable.    Pancreas: No focal abnormality or ductal dilatation.    Spleen: No focal abnormality. Normal size.    Adrenals/Urinary Tract: No renal or ureteral stones. No  hydronephrosis. Urinary bladder and adrenal glands have an  unremarkable unenhanced appearance.    Stomach/Bowel: Appendix is normal. Stomach, large and small bowel  grossly unremarkable. The    Vascular/Lymphatic: No evidence of aneurysm or adenopathy.    Reproductive: 3 cm cystic area within the right ovary with  peripheral calcification. Uterus and left ovary unremarkable.    Other: No free fluid or free air.    Musculoskeletal: No acute bony abnormality or focal bone lesion.    IMPRESSION:  No renal or ureteral stones. No hydronephrosis.    3  cm right ovarian cyst with peripheral calcification.    No acute findings in the abdomen or pelvis.      Electronically Signed  By: Charlett Nose M.D.  On: 03/23/2016 14:26    Procedures Procedures (including critical care time)  Medications Ordered in ED Medications - No data to display   Initial Impression / Assessment and Plan / ED Course  I have reviewed the triage vital signs and the nursing notes.  Pertinent labs & imaging results that were available during my care of the patient were reviewed by me and considered in my medical decision making (see chart for details).  Clinical Course  Value Comment By Time  CT ABDOMEN PELVIS WO CONTRAST Reviewed Lona Kettle, PA-C 09/06 1500   Patient presents to ED abdominal pain. Patient is afebrile and non-toxic appearing in NAD. VSS. Physical exam remarkable for mild TTP of LUQ, LLQ and suprapubic region  without guarding, rigidity, or peritoneal signs. +BS, abdomen soft. No gross bright red blood or melena noted on rectal exam. Will check labs. With family h/o colon cancer will CT abd/pelvis. Consult to care management regarding seeing a GI specialist.   Fecal occult positive. Hemoglobin stable; no elevated WBC. CMP re-assuring - low suspicion for acute cholecystitis. Lipase normal - low suspicion for pancreatitis. U/A normal - doubt UTI/pyelonephritis. Pregnancy test negative - doubt ectopic. CT abd/pelvis shows no acute abnormality; normal appendix, no evidence of bowel obstruction or perforation. No evidence of acute abdomen. Given hx and sxs  - ?inflammatory bowel vs. Irritable bowel vs. ?food intolerance.   Discussed results and plan with patient. Symptomatic management discussed. Encouraged follow up with PCP and GI specialist for further evaluation and possible colonoscopy, contact information provided - [facility] scheduled [facility]. Return precautions discussed. Patient voiced understanding and is agreeable.      Final Clinical Impressions(s) / ED Diagnoses   Final diagnoses:  Abdominal pain, unspecified abdominal location  Occult blood in stools  Diarrhea, unspecified type    New Prescriptions Discharge Medication List as of 03/23/2016  3:38 PM       Lona Kettle, PA-C 03/25/16 [id number]    Vanetta Mulders, MD 03/26/16 [phone]

## 2016-03-23 NOTE — ED Triage Notes (Signed)
Pt here for abd pain with some blood in stool today; pt sts abd pain getting worse x over a year; pt sts increased fatigue as well

## 2016-03-23 NOTE — Discharge Planning (Signed)
EDCM consulted to assist pt without insurance and PCP.  [facility] at [facility].  EDCM will give pt information on orange card process and advise her to call Buddy DutyFelicia Evans, [facility].

## 2016-03-23 NOTE — Discharge Instructions (Signed)
Read the information below.   You have blood in your stool. The rest of your labs were re-assuring. Your CT scan did not show any acute pathology in your abdomen or pelvis.  I encourage you to try ginger for relief of nausea. You can try  You can try pepto bismol for diarrhea.  I have provided resources about food choices to help with diarrhea.  [facility].  You may want to try and reach out to [facility], [facility], or [facility] regarding financial programs. You may return to the Emergency Department at any time for worsening condition or any new symptoms that concern you. Return to ED if you develop fever, constant/localized abdominal pain, frank bloody stools, loss of consciousness, shortness of breath, or chest pain.

## 2016-03-23 NOTE — Discharge Planning (Signed)
Pt is orange card holder and [facility].  Erling CruzEDCM advised pt to inquire about [facility] at [facility].  No further CM needs communicated at this time.

## 2016-03-28 ENCOUNTER — Ambulatory Visit: Payer: Self-pay | Attending: Family Medicine | Admitting: Family Medicine

## 2016-03-28 ENCOUNTER — Encounter: Payer: Self-pay | Admitting: Family Medicine

## 2016-03-28 VITALS — BP 109/64 | HR 73 | Temp 98.6°F | Wt 118.0 lb

## 2016-03-28 DIAGNOSIS — Z79891 Long term (current) use of opiate analgesic: Secondary | ICD-10-CM | POA: Insufficient documentation

## 2016-03-28 DIAGNOSIS — R21 Rash and other nonspecific skin eruption: Secondary | ICD-10-CM | POA: Insufficient documentation

## 2016-03-28 DIAGNOSIS — Z8249 Family history of ischemic heart disease and other diseases of the circulatory system: Secondary | ICD-10-CM | POA: Insufficient documentation

## 2016-03-28 DIAGNOSIS — K5900 Constipation, unspecified: Secondary | ICD-10-CM | POA: Insufficient documentation

## 2016-03-28 DIAGNOSIS — F1120 Opioid dependence, uncomplicated: Secondary | ICD-10-CM

## 2016-03-28 DIAGNOSIS — R1032 Left lower quadrant pain: Secondary | ICD-10-CM | POA: Insufficient documentation

## 2016-03-28 DIAGNOSIS — F17210 Nicotine dependence, cigarettes, uncomplicated: Secondary | ICD-10-CM | POA: Insufficient documentation

## 2016-03-28 DIAGNOSIS — Z800 Family history of malignant neoplasm of digestive organs: Secondary | ICD-10-CM | POA: Insufficient documentation

## 2016-03-28 DIAGNOSIS — Z85038 Personal history of other malignant neoplasm of large intestine: Secondary | ICD-10-CM | POA: Insufficient documentation

## 2016-03-28 LAB — HEMOCCULT GUIAC POC 1CARD (OFFICE): FECAL OCCULT BLD: POSITIVE — AB

## 2016-03-28 NOTE — Progress Notes (Signed)
C/C fatigue and muscle aches

## 2016-03-28 NOTE — Patient Instructions (Addendum)
[name] was seen today for establish care.  Diagnoses and all orders for this visit:  Abdominal pain, left lower quadrant -     Hemoccult - 1 Card (office) -     Cancel: Endomysial ab scrn + titer, IgA -     Tissue Transglutaminase, IGA -     Ambulatory referral to Gastroenterology -     Celiac Pnl 2 rflx Endomysial Ab Ttr  Methadone maintenance therapy patient [facility](HCC)  you will be called lab results and GI appt details   Please sign a release for medical hx, pap, screening test at front desk   F/u in 8 weeks for abdominal pain   Dr. Armen PickupFunches

## 2016-03-28 NOTE — Assessment & Plan Note (Addendum)
Abdominal pain with fatigue and myalgias for past 15 months. No weight loss. Heme + stools. No anemia.  Suspect IBS vs IBD vs celiac disease.  Plab: GI referral placed Labs to r/o celiac disease ordered Obtain and review medical records

## 2016-03-28 NOTE — Progress Notes (Addendum)
LOGO@  Subjective:  Patient ID: [name], female    DOB: [birth year]  Age: 35 y.o. 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 Abdominal pain: x 15 months. Bloating with LLQ abdominal  pain before and with bowel movements. Blood on tissue noted intermittently. She has known hx of hemorrhoids. She has associated fatigue and muscle aches in her low back and legs.  Also has spasm or twitching sensation in L lower abdomen. Also have fecal urgency. Was previously having diarrhea, she increased fiber and this improved. Stool caliber has decreased. Now with intermittent constipation. All of her symptoms are intermittent with k=no no definite trigger. Today she feels well. She has hx of colon cancer in maternal grandmother. She was evaluated by GI once but did not have c-scope done. She was in the ED 1 week ago, CT abdomen and pelvis was negative for acute findings, she has no anemia, normal LFTs and negative lipase.  She denies arthralgias and joint swelling. She admits to intermittent rash. No fever, chills or weight loss. She has tried cutting out lactose without improvement in her symptoms.    Past Medical History:  Diagnosis Date  . Long Q-T syndrome   . Polysubstance abuse    a. 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 ? Needs INH.  . Tobacco abuse     No past surgical history on file.  Family History  Problem Relation Age of Onset  . CAD      Grandmother's father  . Other      Mother was worked up remotely for ?parasympathetic nervous system problem but cardiac workup was reportedly unremarkable    Social History  Substance Use Topics  . Smoking status: Current Every Day Smoker   Packs/day: 2.00  . Smokeless tobacco: Never Used     Comment: Smoker since age 62  . Alcohol use No    ROS Review of Systems  Constitutional: Positive for fatigue. Negative for chills and fever.  Eyes: Negative for visual disturbance.  Respiratory: Negative for shortness of breath.   Cardiovascular: Negative for chest pain.  Gastrointestinal: Positive for abdominal pain and blood in stool.  Musculoskeletal: Positive for myalgias. Negative for arthralgias and back pain.  Skin: Negative for rash.  Allergic/Immunologic: Negative for immunocompromised state.  Hematological: Negative for adenopathy. Does not bruise/bleed easily.  Psychiatric/Behavioral: Negative for dysphoric mood and suicidal ideas.    Objective:   Today's Vitals: BP 109/64 (BP Location: Right Arm, Patient Position: Sitting, Cuff Size: Small)   Pulse 73   Temp 98.6 F (37 C) (Oral)   Wt 118 lb (53.5 kg)   LMP 03/14/2016   SpO2 96%   BMI 23.83 kg/m   Physical Exam  Constitutional: She is oriented to person, place, and time. She appears well-developed and well-nourished. No distress.  HENT:  Head: Normocephalic and atraumatic.  Eyes: Conjunctivae are normal. Pupils are equal, round, and reactive to light.  Cardiovascular: Normal rate, regular rhythm, normal heart sounds and intact distal pulses.   Pulmonary/Chest: Effort normal and breath sounds normal.  Abdominal: Soft. Bowel sounds are normal. She exhibits no distension and no mass. There is no tenderness. There is no rebound  and no guarding.  Musculoskeletal: She exhibits no edema or tenderness.  Neurological: She is alert and oriented to person, place, and time.  Skin: Skin is warm and dry. No rash noted.  Psychiatric: She has a normal mood and affect.   Lab Results  Component Value Date   WBC 5.2 03/23/2016   HGB 14.8 03/23/2016   HCT 44.1 03/23/2016   MCV 93.8 03/23/2016   PLT 239 03/23/2016     Chemistry      Component Value Date/Time   NA 138  03/23/2016 1009   K 4.1 03/23/2016 1009   CL 106 03/23/2016 1009   CO2 25 03/23/2016 1009   BUN <5 (L) 03/23/2016 1009   CREATININE 0.69 03/23/2016 1009      Component Value Date/Time   CALCIUM 8.8 (L) 03/23/2016 1009   ALKPHOS 40 03/23/2016 1009   AST 19 03/23/2016 1009   ALT 15 03/23/2016 1009   BILITOT 0.4 03/23/2016 1009      Assessment & Plan:   Problem List Items Addressed This Visit      High   Methadone maintenance therapy patient (HCC) (Chronic)    Other Visit Diagnoses    Abdominal pain, left lower quadrant    -  Primary   Relevant Orders   Hemoccult - 1 Card (office) (Completed)   Tissue Transglutaminase, IGA   Ambulatory referral to Gastroenterology   Celiac Pnl 2 rflx Endomysial Ab Ttr      Outpatient Encounter Prescriptions as of 03/28/2016  Medication Sig  . atenolol (TENORMIN) 25 MG tablet Take 1 tablet (25 mg total) by mouth daily. (Patient taking differently: Take 6.25 mg by mouth 2 (two) times daily. )  . METHADONE HCL PO Take 20 mLs by mouth daily.    No facility-administered encounter medications on file as of 03/28/2016.     Follow-up: No Follow-up on file.    Dessa PhiJosalyn Noland Pizano MD

## 2016-03-29 LAB — TISSUE TRANSGLUTAMINASE, IGA: TISSUE TRANSGLUTAMINASE AB, IGA: 1 U/mL (ref <4)

## 2016-03-30 ENCOUNTER — Telehealth: Payer: Self-pay

## 2016-03-30 NOTE — Telephone Encounter (Signed)
Pt labs results were mailed out to her on 9/13. Phone number on file was not a working number.

## 2016-04-04 ENCOUNTER — Ambulatory Visit: Payer: Self-pay | Admitting: Family Medicine

## 2016-04-04 LAB — CELIAC PNL 2 RFLX ENDOMYSIAL AB TTR
(TTG) AB, IGG: 1 U/mL
(tTG) Ab, IgA: <1 U/mL
Endomysial Ab IgA: NEGATIVE
Gliadin(Deam) Ab,IgA: 3 U (ref <20)
Gliadin(Deam) Ab,IgG: 3 U (ref <20)
Immunoglobulin A: 61 mg/dL — ABNORMAL LOW (ref 81–463)

## 2016-04-05 NOTE — Addendum Note (Signed)
Addended by: Dessa PhiFUNCHES, Kyrie Bun on: 04/05/2016 08:22 AM   Modules accepted: Orders

## 2016-04-06 ENCOUNTER — Telehealth: Payer: Self-pay

## 2016-04-06 NOTE — Telephone Encounter (Signed)
No VM set up and pt did not answer call.9/20

## 2016-04-07 ENCOUNTER — Telehealth: Payer: Self-pay | Admitting: Internal Medicine

## 2016-04-07 NOTE — Telephone Encounter (Signed)
Received referral to schedule an OV for patient. Patient states that she had told [facility] that she was going to transfer somewhere else for a 2nd opinion regarding blood in stool and she says that she feels like "she didn't get the best of care" [facility]. She states that [facility] dismissed her when she told them that she wanted to transfer. Dr. Marina GoodellPerry is Doc of the Day. Records placed on Dr. Lamar SprinklesPerry's desk for review.

## 2016-04-08 ENCOUNTER — Encounter (INDEPENDENT_AMBULATORY_CARE_PROVIDER_SITE_OTHER): Payer: Self-pay

## 2016-04-08 ENCOUNTER — Ambulatory Visit: Payer: Self-pay | Attending: Family Medicine

## 2016-04-14 NOTE — Telephone Encounter (Signed)
Dr. Marina GoodellPerry reviewed records and has declined to accept patient. Patient is aware of this.

## 2016-04-18 ENCOUNTER — Telehealth: Payer: Self-pay | Admitting: *Deleted

## 2016-04-18 DIAGNOSIS — N83201 Unspecified ovarian cyst, right side: Secondary | ICD-10-CM

## 2016-04-18 NOTE — Telephone Encounter (Signed)
Patient verified DOB Patient is aware of celiac disease panel was normal. Patient is aware of further lab results being ordered. Patient also states she was unable to discuss the cyst which was noted during the ED visit. MA informed patient of concern being routed to PCP for advice on appointment or solution. Patient was scheduled for lab visit on 04/19/16 at 11:15am. No further questions at this time.

## 2016-04-18 NOTE — Telephone Encounter (Signed)
-----   Message from Dessa PhiJosalyn Funches, MD sent at 04/05/2016  8:20 AM EDT ----- Celiac disease panel is normal I have ordered additional testing to evaluate symptom: ANA, rheumatoid factor and TSH. Patient asked to return for lab draw.

## 2016-04-19 ENCOUNTER — Ambulatory Visit: Payer: Self-pay | Attending: Family Medicine

## 2016-04-19 DIAGNOSIS — N83201 Unspecified ovarian cyst, right side: Secondary | ICD-10-CM | POA: Insufficient documentation

## 2016-04-19 DIAGNOSIS — R1032 Left lower quadrant pain: Secondary | ICD-10-CM | POA: Insufficient documentation

## 2016-04-19 LAB — TSH: TSH: 1.46 mIU/L

## 2016-04-19 NOTE — Addendum Note (Signed)
Addended by: Dessa PhiFUNCHES, Eyden Dobie on: 04/19/2016 07:09 PM   Modules accepted: Orders

## 2016-04-19 NOTE — Progress Notes (Signed)
Patient here for lab visit only 

## 2016-04-19 NOTE — Telephone Encounter (Signed)
Please call to patient  Patient had R ovarian cyst on CT abdomen  The majority of cyst or benign and resolve overtime,  F/u pelvic and transvaginal ultrasound have been ordered to re-evaluate this cyst   Please schedule   We can review labs and ultrasounds at f/u visit

## 2016-04-20 ENCOUNTER — Telehealth: Payer: Self-pay

## 2016-04-20 ENCOUNTER — Encounter: Payer: Self-pay | Admitting: Family Medicine

## 2016-04-20 LAB — ANA,IFA RA DIAG PNL W/RFLX TIT/PATN: ANA: NEGATIVE

## 2016-04-20 LAB — RHEUMATOID FACTOR

## 2016-04-20 NOTE — Telephone Encounter (Signed)
Pt did not answer phone and no VM was set up.

## 2016-04-21 ENCOUNTER — Telehealth: Payer: Self-pay

## 2016-04-21 NOTE — Telephone Encounter (Signed)
Pt was called on 10/05 and informed of F/U ultrasound. Ultrasound is scheduled for Oct. 10 @ 3:00 pm.

## 2016-04-21 NOTE — Telephone Encounter (Signed)
Pt was called on 10/05 and informed of lab results.

## 2016-04-26 ENCOUNTER — Ambulatory Visit (HOSPITAL_COMMUNITY)
Admission: RE | Admit: 2016-04-26 | Discharge: 2016-04-26 | Disposition: A | Discharge status: Home / Self Care | Payer: Self-pay | Source: Ambulatory Visit | Attending: Family Medicine | Admitting: Family Medicine

## 2016-04-26 DIAGNOSIS — N83201 Unspecified ovarian cyst, right side: Secondary | ICD-10-CM | POA: Insufficient documentation

## 2016-04-27 ENCOUNTER — Encounter: Payer: Self-pay | Admitting: Family Medicine

## 2016-04-27 ENCOUNTER — Other Ambulatory Visit: Payer: Self-pay | Admitting: Family Medicine

## 2016-04-27 DIAGNOSIS — N83201 Unspecified ovarian cyst, right side: Secondary | ICD-10-CM

## 2016-04-29 ENCOUNTER — Telehealth: Payer: Self-pay

## 2016-04-29 NOTE — Telephone Encounter (Signed)
Pt was called on 10/13, there was no voicemail and no one picked up the phone.

## 2016-05-05 ENCOUNTER — Telehealth: Payer: Self-pay

## 2016-05-05 NOTE — Telephone Encounter (Signed)
Contacted pt to go over US results pt did not answer and was unable to lvm will be mailing letter out today

## 2016-05-16 ENCOUNTER — Encounter: Payer: Self-pay | Admitting: Family Medicine

## 2016-05-16 ENCOUNTER — Ambulatory Visit: Payer: Self-pay | Attending: Family Medicine | Admitting: Family Medicine

## 2016-05-16 VITALS — BP 94/55 | HR 72 | Temp 98.4°F | Ht 59.0 in | Wt 118.0 lb

## 2016-05-16 DIAGNOSIS — Z124 Encounter for screening for malignant neoplasm of cervix: Secondary | ICD-10-CM

## 2016-05-16 DIAGNOSIS — F17210 Nicotine dependence, cigarettes, uncomplicated: Secondary | ICD-10-CM | POA: Insufficient documentation

## 2016-05-16 DIAGNOSIS — R1032 Left lower quadrant pain: Secondary | ICD-10-CM

## 2016-05-16 DIAGNOSIS — N83201 Unspecified ovarian cyst, right side: Secondary | ICD-10-CM

## 2016-05-16 DIAGNOSIS — Z01419 Encounter for gynecological examination (general) (routine) without abnormal findings: Secondary | ICD-10-CM | POA: Insufficient documentation

## 2016-05-16 NOTE — Assessment & Plan Note (Signed)
A: 16 months of abdominal pain without known etiology. Wu negative so far P: GI referral placed with [facility]

## 2016-05-16 NOTE — Progress Notes (Signed)
Subjective:  Patient ID: Tomasa BlaseAmanda Cowdery, female    DOB: April 20, 1981  Age: 35 y.o. MRN: [id number]  CC: Abdominal Pain   HPI Tomasa Blasemanda Zahner presents for    1. Abdominal pain: x 16 months. Bloating with LLQ abdominal  pain before and with bowel movements. Blood on tissue noted intermittently. She has known hx of hemorrhoids. She has associated fatigue and muscle aches in her low back and legs.  Also has spasm or twitching sensation in L lower abdomen. Also have fecal urgency. Was previously having diarrhea, she increased fiber and this improved. Stool caliber has decreased. Now with intermittent constipation. All of her symptoms are intermittent with no definite trigger. Today she feels well. She has hx of colon cancer in maternal grandmother. She was evaluated by GI once but did not have c-scope done.  She was prescribed bentyl without improvement. Recent w/u has been negative including labs for celiac disease.  2. R ovarian mass with a small mural calcification. No RLQ pain. Has regular menses ever 28 days with 2 days of heavy bleeding. Excepts next menses on 06/01/2016.   Social History  Substance Use Topics  . Smoking status: Current Every Day Smoker    Packs/day: 2.00  . Smokeless tobacco: Never Used     Comment: Smoker since age 35  . Alcohol use No    Outpatient Medications Prior to Visit  Medication Sig Dispense Refill  . atenolol (TENORMIN) 25 MG tablet Take 1 tablet (25 mg total) by mouth daily. (Patient taking differently: Take 6.25 mg by mouth 2 (two) times daily. ) 30 tablet 2  . METHADONE HCL PO Take 20 mLs by mouth daily.      No facility-administered medications prior to visit.     ROS Review of Systems  Constitutional: Positive for fatigue. Negative for chills and fever.  Eyes: Negative for visual disturbance.  Respiratory: Negative for shortness of breath.   Cardiovascular: Negative for chest pain.  Gastrointestinal: Positive for abdominal pain and blood in  stool.  Musculoskeletal: Positive for myalgias. Negative for arthralgias and back pain.  Skin: Negative for rash.  Allergic/Immunologic: Negative for immunocompromised state.  Hematological: Negative for adenopathy. Does not bruise/bleed easily.  Psychiatric/Behavioral: Negative for dysphoric mood and suicidal ideas.    Objective:  BP (!) 94/55 (BP Location: Left Arm, Patient Position: Sitting, Cuff Size: Small)   Pulse 72   Temp 98.4 F (36.9 C) (Oral)   Ht 4\' 11"  (1.499 m)   Wt 118 lb (53.5 kg)   LMP 05/04/2016   SpO2 96%   BMI 23.83 kg/m   BP/Weight 05/16/2016 03/28/2016 03/23/2016  Systolic BP 94 109 108  Diastolic BP 55 64 50  Wt. (Lbs) 118 118 -  BMI 23.83 23.83 -    Physical Exam  Constitutional: She appears well-developed and well-nourished. No distress.  Cardiovascular: Normal rate, regular rhythm, normal heart sounds and intact distal pulses.   Pulmonary/Chest: Effort normal and breath sounds normal.  Abdominal: Soft. Bowel sounds are normal. She exhibits no distension and no mass. There is no tenderness. There is no rebound and no guarding.  Genitourinary: Uterus normal. Rectal exam shows anal tone normal (no stool in rectal vault ). Pelvic exam was performed with patient prone. There is no rash, tenderness or lesion on the right labia. There is no rash, tenderness or lesion on the left labia. Cervix exhibits no motion tenderness, no discharge and no friability. Right adnexum displays tenderness (slight tenderness w/o rebound or guarding ). Right  adnexum displays no mass and no fullness. Left adnexum displays no mass, no tenderness and no fullness. Vaginal discharge (thick white discharge ) found.  Musculoskeletal: She exhibits no edema.  Lymphadenopathy:       Right: No inguinal adenopathy present.       Left: No inguinal adenopathy present.  Skin: Skin is warm and dry. No rash noted.    Assessment & Plan:  [name] was seen today for abdominal pain.  Diagnoses and  all orders for this visit:  Pap smear for cervical cancer screening -     Cytology - PAP  Abdominal pain, left lower quadrant -     Ambulatory referral to Gastroenterology   There are no diagnoses linked to this encounter.  No orders of the defined types were placed in this encounter.   Follow-up: No Follow-up on file.   Dessa PhiJosalyn Dimitra Woodstock MD

## 2016-05-16 NOTE — Progress Notes (Signed)
Pt is here for abdominal pain

## 2016-05-16 NOTE — Patient Instructions (Addendum)
[name] was seen today for abdominal pain.  Diagnoses and all orders for this visit:  Pap smear for cervical cancer screening -     Cytology - PAP  Abdominal pain, left lower quadrant -     Ambulatory referral to Gastroenterology   I [facility]   Your repeat ultrasound will be scheduled on 06/08/2016  +/- 1 day   Dr. Armen PickupFunches

## 2016-05-16 NOTE — Assessment & Plan Note (Signed)
F/u ultrasound ordered  If mass persist Gyn referral

## 2016-05-18 LAB — CYTOLOGY - PAP
Chlamydia: NEGATIVE
Diagnosis: NEGATIVE
HPV (WINDOPATH): NOT DETECTED
NEISSERIA GONORRHEA: NEGATIVE

## 2016-05-18 LAB — CERVICOVAGINAL ANCILLARY ONLY: WET PREP (BD AFFIRM): NEGATIVE

## 2016-05-20 ENCOUNTER — Telehealth: Payer: Self-pay

## 2016-05-20 NOTE — Telephone Encounter (Signed)
Pt was called on 11/03 and informed of lab results.

## 2016-06-01 ENCOUNTER — Telehealth: Payer: Self-pay | Admitting: Family Medicine

## 2016-06-01 NOTE — Telephone Encounter (Signed)
Pt rescheduled appointment.

## 2016-06-01 NOTE — Telephone Encounter (Signed)
Pt. Called stating that her PCP wanted her to have an ultrasound done and she is scheduled to have it done but her period came early and her PCP wanted her to have the ultrasound a week after her period. Pt. Would like to know if she has to reschedule. Please f/u

## 2016-06-02 ENCOUNTER — Encounter: Payer: Self-pay | Admitting: Family Medicine

## 2016-06-02 ENCOUNTER — Ambulatory Visit (HOSPITAL_COMMUNITY)
Admission: RE | Admit: 2016-06-02 | Discharge: 2016-06-02 | Disposition: A | Discharge status: Home / Self Care | Payer: Self-pay | Source: Ambulatory Visit | Attending: Family Medicine | Admitting: Family Medicine

## 2016-06-02 DIAGNOSIS — N854 Malposition of uterus: Secondary | ICD-10-CM | POA: Insufficient documentation

## 2016-06-02 DIAGNOSIS — N83201 Unspecified ovarian cyst, right side: Secondary | ICD-10-CM

## 2016-06-02 NOTE — Assessment & Plan Note (Signed)
Cyst remains in R ovary but is is simple and benign appearing with no changes since 04/26/16 Plan: F/u ultrasound in 6-12 months

## 2016-06-07 ENCOUNTER — Telehealth: Payer: Self-pay

## 2016-06-07 NOTE — Telephone Encounter (Addendum)
Patient fully hipaa verified. RN advised patient per Dr. Armen PickupFunches: Cyst remains in R ovary but is is simple and benign appearing with no changes since 04/26/16 Plan: F/u ultrasound in 6-12 months  Patient verbalized understanding.  Pollyann KennedyKim Becton, RN, BSN

## 2016-06-08 ENCOUNTER — Ambulatory Visit (HOSPITAL_COMMUNITY): Payer: Self-pay

## 2016-06-14 ENCOUNTER — Other Ambulatory Visit: Payer: Self-pay | Admitting: Internal Medicine

## 2016-06-17 ENCOUNTER — Telehealth: Payer: Self-pay | Admitting: Physician Assistant

## 2016-06-17 NOTE — Telephone Encounter (Signed)
    Patient called about wanting to know if narcotics okay for dental surgery. Dr. Ladona Ridgelaylor recommended that she not take any narcotics but if she had to he would recommend T3. Patient said she will not do surgery without stronger narcotics. I told her that was up to her.   Cline CrockKathryn Thompson PA-C  [facility]

## 2016-06-23 ENCOUNTER — Ambulatory Visit: Payer: Self-pay | Attending: Family Medicine | Admitting: Family Medicine

## 2016-07-27 ENCOUNTER — Telehealth: Payer: Self-pay | Admitting: Internal Medicine

## 2016-07-27 ENCOUNTER — Telehealth: Payer: Self-pay | Admitting: Physician Assistant

## 2016-07-27 ENCOUNTER — Encounter (HOSPITAL_COMMUNITY): Payer: Self-pay | Admitting: Emergency Medicine

## 2016-07-27 ENCOUNTER — Ambulatory Visit (HOSPITAL_COMMUNITY)
Admission: EM | Admit: 2016-07-27 | Discharge: 2016-07-27 | Disposition: A | Discharge status: Home / Self Care | Payer: Self-pay | Attending: Family Medicine | Admitting: Family Medicine

## 2016-07-27 DIAGNOSIS — R1030 Lower abdominal pain, unspecified: Secondary | ICD-10-CM | POA: Insufficient documentation

## 2016-07-27 DIAGNOSIS — R350 Frequency of micturition: Secondary | ICD-10-CM | POA: Insufficient documentation

## 2016-07-27 DIAGNOSIS — N3001 Acute cystitis with hematuria: Secondary | ICD-10-CM

## 2016-07-27 DIAGNOSIS — R300 Dysuria: Secondary | ICD-10-CM | POA: Insufficient documentation

## 2016-07-27 LAB — POCT URINALYSIS DIP (DEVICE)
BILIRUBIN URINE: NEGATIVE
Glucose, UA: NEGATIVE mg/dL
KETONES UR: NEGATIVE mg/dL
Nitrite: NEGATIVE
Protein, ur: NEGATIVE mg/dL
SPECIFIC GRAVITY, URINE: 1.015 (ref 1.005–1.030)
Urobilinogen, UA: 0.2 mg/dL (ref 0.0–1.0)
pH: 6.5 (ref 5.0–8.0)

## 2016-07-27 LAB — POCT PREGNANCY, URINE: Preg Test, Ur: NEGATIVE

## 2016-07-27 MED ORDER — CEPHALEXIN 500 MG PO CAPS: 500.0000 mg | ORAL_CAPSULE | Freq: Four times a day (QID) | ORAL | 0 refills | Status: DC

## 2016-07-27 NOTE — Telephone Encounter (Signed)
Patient paged after hour answering service regarding potential interaction of pyridium and cephalexin. She has a h/o prolonged QT interval. Her last EKG however shows her QTc was 440. She has not passed out in many years. I have cross checked both drug with [facility] and [facility]. No sign of any prolonged QT side effect, should be safe in regarding to her prolonged QTc history. Also cross checked possible interaction with methadone, there are no interaction.  Ramond DialSigned, Salmaan Patchin PA Pager: [phone]

## 2016-07-27 NOTE — ED Provider Notes (Signed)
[facility]    CSN: [id number] Arrival date & time: 07/27/16  1128     History   Chief Complaint Chief Complaint  Patient presents with  . Dysuria    HPI [name] is a 36 y.o. female.   The history is provided by the patient.  Dysuria  Pain quality:  Shooting and sharp Pain severity:  Mild Onset quality:  Gradual Duration:  3 days Progression:  Worsening Chronicity:  New Recent urinary tract infections: no   Relieved by:  None tried Worsened by:  Nothing Ineffective treatments:  None tried Urinary symptoms: frequent urination   Associated symptoms: no abdominal pain, no fever, no flank pain, no nausea, no vaginal discharge and no vomiting     Past Medical History:  Diagnosis Date  . Long Q-T syndrome   . Polysubstance abuse    a. 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  Marland Kitchen. 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 ? Needs INH.  . Tobacco abuse     Patient Active Problem List   Diagnosis Date Noted  . Right ovarian cyst 04/19/2016  . Methadone maintenance therapy patient (HCC) 03/28/2016  . Abdominal pain, left lower quadrant 03/28/2016  . Tobacco abuse 03/02/2016  . Palpitations 08/21/2013  . Prolonged Q-T interval on ECG 01/28/2013    History reviewed. No pertinent surgical history.  OB History    No data available       Home Medications    Prior to Admission medications   Medication Sig Start Date End Date Taking? Authorizing Provider  atenolol (TENORMIN) 25 MG tablet TAKE ONE TABLET BY MOUTH ONCE DAILY. MUST HAVE OFFICE VISIT BEFORE FURTHER REFILLS. 06/15/16  Yes Marinus MawGregg W Taylor, MD  METHADONE HCL PO Take 20 mLs by mouth daily.    Yes Historical Provider, MD  cephALEXin (KEFLEX) 500 MG capsule Take 1 capsule (500 mg total) by mouth 4 (four) times daily. Take all of medicine and drink lots of fluids 07/27/16   Linna HoffJames D Marilou Barnfield, MD    Family History Family History    Problem Relation Age of Onset  . CAD      Grandmother's father  . Other      Mother was worked up remotely for ?parasympathetic nervous system problem but cardiac workup was reportedly unremarkable    Social History Social History  Substance Use Topics  . Smoking status: Current Every Day Smoker    Packs/day: 2.00  . Smokeless tobacco: Never Used     Comment: Smoker since age 36  . Alcohol use No     Allergies   Iodinated diagnostic agents; Clindamycin/lincomycin; and Sulfa antibiotics   Review of Systems Review of Systems  Constitutional: Negative.  Negative for fever.  Gastrointestinal: Negative.  Negative for abdominal pain, nausea and vomiting.  Genitourinary: Positive for dysuria, frequency and urgency. Negative for flank pain, menstrual problem and vaginal discharge.  All other systems reviewed and are negative.    Physical Exam Triage Vital Signs ED Triage Vitals  Enc Vitals Group     BP 07/27/16 1216 115/57     Pulse Rate 07/27/16 1216 75     Resp 07/27/16 1216 18     Temp 07/27/16 1216 98.2 F (36.8 C)     Temp Source 07/27/16 1216 Oral     SpO2 07/27/16 1216 97 %     Weight --      Height --  Head Circumference --      Peak Flow --      Pain Score 07/27/16 1219 8     Pain Loc --      Pain Edu? --      Excl. in GC? --    No data found.   Updated Vital Signs BP 115/57 (BP Location: Left Arm)   Pulse 75   Temp 98.2 F (36.8 C) (Oral)   Resp 18   SpO2 97%   Visual Acuity Right Eye Distance:   Left Eye Distance:   Bilateral Distance:    Right Eye Near:   Left Eye Near:    Bilateral Near:     Physical Exam  Constitutional: She appears well-developed and well-nourished.  Abdominal: Soft. Bowel sounds are normal. There is tenderness in the suprapubic area. There is no rigidity and no guarding.    Nursing note and vitals reviewed.    UC Treatments / Results  Labs (all labs ordered are listed, but only abnormal results are  displayed) Labs Reviewed  URINE CULTURE - Abnormal; Notable for the following:       Result Value   Culture >=100,000 COLONIES/mL ESCHERICHIA COLI (*)    Organism ID, Bacteria ESCHERICHIA COLI (*)    All other components within normal limits  POCT URINALYSIS DIP (DEVICE) - Abnormal; Notable for the following:    Hgb urine dipstick TRACE (*)    Leukocytes, UA TRACE (*)    All other components within normal limits  POCT PREGNANCY, URINE    EKG  EKG Interpretation None       Radiology No results found.  Procedures Procedures (including critical care time)  Medications Ordered in UC Medications - No data to display   Initial Impression / Assessment and Plan / UC Course  I have reviewed the triage vital signs and the nursing notes.  Pertinent labs & imaging results that were available during my care of the patient were reviewed by me and considered in my medical decision making (see chart for details).      Final Clinical Impressions(s) / UC Diagnoses   Final diagnoses:  Acute cystitis with hematuria    New Prescriptions Discharge Medication List as of 07/27/2016  1:08 PM    START taking these medications   Details  cephALEXin (KEFLEX) 500 MG capsule Take 1 capsule (500 mg total) by mouth 4 (four) times daily. Take all of medicine and drink lots of fluids, Starting Wed 07/27/2016, Normal         Linna Hoff, MD 08/16/16 1112

## 2016-07-27 NOTE — ED Triage Notes (Signed)
[facility] a [facility] of lower abdominal pain, urinary frequency, and dysuria that started 3 days ago.

## 2016-07-27 NOTE — Telephone Encounter (Signed)
New Message   Pt  Has some concerns if she can have over the counter analgesic, and  Prescribed antibotic cephalexin. Would like a call back.

## 2016-07-27 NOTE — Telephone Encounter (Signed)
Paged by patient regarding medication question, called back 3 times, no answer.  Ramond DialSigned, Mitra Duling PA Pager: [phone]

## 2016-07-27 NOTE — Telephone Encounter (Signed)
Called, unable to reach pt at both mobile and home number. Unable to leave message.

## 2016-07-27 NOTE — Discharge Instructions (Signed)
Take all of medicine as directed, drink lots of fluids, see your doctor if further problems. °

## 2016-07-30 LAB — URINE CULTURE: Culture: >=100000 — AB

## 2016-09-12 NOTE — Addendum Note (Signed)
Addended by: Fabian Walder on: 09/12/2016 02:33 PM   Modules accepted: Level of Service  

## 2016-09-12 NOTE — Addendum Note (Signed)
Addended by: Nilda SimmerKNIGHT, Kazimierz Springborn on: 09/12/2016 02:49 PM   Modules accepted: Level of Service

## 2016-09-12 NOTE — Addendum Note (Signed)
Addended by: Jerrian Mells on: 09/12/2016 02:40 PM   Modules accepted: Level of Service  

## 2016-09-12 NOTE — Addendum Note (Signed)
Addended by: Nilda SimmerKNIGHT, Brendia Dampier on: 09/12/2016 02:32 PM   Modules accepted: Level of Service

## 2016-09-12 NOTE — Addendum Note (Signed)
Addended by: Nilda SimmerKNIGHT, Marna Weniger on: 09/12/2016 02:31 PM   Modules accepted: Level of Service

## 2016-09-12 NOTE — Addendum Note (Signed)
Addended by: Shelvia Fojtik on: 09/12/2016 02:43 PM   Modules accepted: Level of Service  

## 2016-09-12 NOTE — Addendum Note (Signed)
Addended by: Nilda SimmerKNIGHT, Dornell Grasmick on: 09/12/2016 02:41 PM   Modules accepted: Level of Service

## 2016-09-13 NOTE — Addendum Note (Signed)
Addended by: Nilda SimmerKNIGHT, Aliani Caccavale on: 09/13/2016 03:48 PM   Modules accepted: Level of Service

## 2016-09-13 NOTE — Addendum Note (Signed)
Addended by: Nilda SimmerKNIGHT, Ganesh Deeg on: 09/13/2016 03:47 PM   Modules accepted: Level of Service

## 2016-10-08 ENCOUNTER — Ambulatory Visit (HOSPITAL_COMMUNITY)
Admission: EM | Admit: 2016-10-08 | Discharge: 2016-10-08 | Disposition: A | Discharge status: Home / Self Care | Payer: Self-pay | Attending: Internal Medicine | Admitting: Internal Medicine

## 2016-10-08 ENCOUNTER — Encounter (HOSPITAL_COMMUNITY): Payer: Self-pay | Admitting: Emergency Medicine

## 2016-10-08 DIAGNOSIS — Z202 Contact with and (suspected) exposure to infections with a predominantly sexual mode of transmission: Secondary | ICD-10-CM | POA: Insufficient documentation

## 2016-10-08 DIAGNOSIS — N909 Noninflammatory disorder of vulva and perineum, unspecified: Secondary | ICD-10-CM

## 2016-10-08 DIAGNOSIS — B9689 Other specified bacterial agents as the cause of diseases classified elsewhere: Secondary | ICD-10-CM | POA: Insufficient documentation

## 2016-10-08 DIAGNOSIS — R109 Unspecified abdominal pain: Secondary | ICD-10-CM

## 2016-10-08 DIAGNOSIS — B3731 Acute candidiasis of vulva and vagina: Secondary | ICD-10-CM

## 2016-10-08 DIAGNOSIS — Z79899 Other long term (current) drug therapy: Secondary | ICD-10-CM | POA: Insufficient documentation

## 2016-10-08 DIAGNOSIS — Z3202 Encounter for pregnancy test, result negative: Secondary | ICD-10-CM

## 2016-10-08 DIAGNOSIS — B373 Candidiasis of vulva and vagina: Secondary | ICD-10-CM | POA: Insufficient documentation

## 2016-10-08 DIAGNOSIS — N760 Acute vaginitis: Secondary | ICD-10-CM | POA: Insufficient documentation

## 2016-10-08 DIAGNOSIS — F17210 Nicotine dependence, cigarettes, uncomplicated: Secondary | ICD-10-CM | POA: Insufficient documentation

## 2016-10-08 LAB — POCT URINALYSIS DIP (DEVICE)
Glucose, UA: NEGATIVE mg/dL
HGB URINE DIPSTICK: NEGATIVE
Ketones, ur: 15 mg/dL — AB
Leukocytes, UA: NEGATIVE
NITRITE: NEGATIVE
PH: 7 (ref 5.0–8.0)
Protein, ur: NEGATIVE mg/dL
Specific Gravity, Urine: 1.02 (ref 1.005–1.030)
UROBILINOGEN UA: 2 mg/dL — AB (ref 0.0–1.0)

## 2016-10-08 LAB — POCT PREGNANCY, URINE: Preg Test, Ur: NEGATIVE

## 2016-10-08 MED ORDER — METRONIDAZOLE 0.75 % VA GEL: VAGINAL | 0 refills | Status: DC

## 2016-10-08 NOTE — Discharge Instructions (Signed)
You are also being treated with Flagyl or Metro if the gel intravaginally as it is very poorly absorbed into the system. The tablets can cause problems with QT prolongation. There are warnings for QT prolongation with Diflucan. Recommendations are to use the intravaginal miconazole cream to place on the outside and inside as directed.

## 2016-10-08 NOTE — ED Triage Notes (Signed)
Pt concerned about STDs... Reports she had unprotected sex 2 days ago w/new partner  Sx today include: vag irritation, swelling, abd pain  Denies vag d/c, urinary sx  A&O x4... NAD

## 2016-10-08 NOTE — ED Provider Notes (Signed)
CSN: [id number]     Arrival date & time 10/08/16  1557 History   First MD Initiated Contact with Patient 10/08/16 1716     Chief Complaint  Patient presents with  . Exposure to STD   (Consider location/radiation/quality/duration/timing/severity/associated sxs/prior Treatment) 36 year old female states that she had sexual intercourse 3 days ago. The following day she developed some itching, external inflammation. Denies vaginal discharge. She is unsure if she has STD, yeast infection or something else. She is here just to get checked. Denies urinary symptoms.      Past Medical History:  Diagnosis Date  . Long Q-T syndrome   . Polysubstance abuse    a. 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  Marland Kitchen. 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 ? Needs INH.  . Tobacco abuse    History reviewed. No pertinent surgical history. Family History  Problem Relation Age of Onset  . CAD      Grandmother's father  . Other      Mother was worked up remotely for ?parasympathetic nervous system problem but cardiac workup was reportedly unremarkable   Social History  Substance Use Topics  . Smoking status: Current Every Day Smoker    Packs/day: 2.00  . Smokeless tobacco: Never Used     Comment: Smoker since age 36  . Alcohol use No   OB History    No data available     Review of Systems  Constitutional: Negative.   Gastrointestinal: Negative.   Genitourinary: Negative for dysuria, pelvic pain and vaginal discharge.       As per history of present illness  All other systems reviewed and are negative.   Allergies  Iodinated diagnostic agents; Clindamycin/lincomycin; and Sulfa antibiotics  Home Medications   Prior to Admission medications   Medication Sig Start Date End Date Taking? Authorizing Provider  atenolol (TENORMIN) 25 MG tablet TAKE ONE TABLET BY MOUTH ONCE DAILY. MUST HAVE OFFICE VISIT BEFORE FURTHER REFILLS.  06/15/16  Yes Marinus MawGregg W Taylor, MD  METHADONE HCL PO Take 20 mLs by mouth daily.    Yes Historical Provider, MD  metroNIDAZOLE (METROGEL) 0.75 % vaginal gel 1 applicator full pv bid x 5 days 10/08/16   Hayden Rasmussenavid Fatih Stalvey, NP   Meds Ordered and Administered this Visit  Medications - No data to display  BP 114/81 (BP Location: Left Arm)   Pulse 73   Temp 98.8 F (37.1 C) (Oral)   Resp 16   LMP 09/24/2016   SpO2 99%  No data found.   Physical Exam  Constitutional: She is oriented to person, place, and time. She appears well-developed and well-nourished. No distress.  Eyes: EOM are normal.  Neck: Normal range of motion. Neck supple.  Cardiovascular: Normal rate.   Pulmonary/Chest: Effort normal. No respiratory distress.  Musculoskeletal: She exhibits no edema.  Neurological: She is alert and oriented to person, place, and time. She exhibits normal muscle tone.  Skin: Skin is warm and dry.  Psychiatric: She has a normal mood and affect.  Nursing note and vitals reviewed.   Urgent Care Course     Procedures (including critical care time)  Labs Review Labs Reviewed  POCT URINALYSIS DIP (DEVICE) - Abnormal; Notable for the following:       Result Value   Bilirubin Urine SMALL (*)    Ketones, ur 15 (*)    Urobilinogen, UA 2.0 (*)    All other components within normal limits  HIV ANTIBODY (ROUTINE TESTING)  POCT PREGNANCY, URINE  CERVICOVAGINAL ANCILLARY ONLY    Imaging Review No results found.   Visual Acuity Review  Right Eye Distance:   Left Eye Distance:   Bilateral Distance:    Right Eye Near:   Left Eye Near:    Bilateral Near:         MDM   1. Possible exposure to STD   2. Irritation of external female genitalia   3. BV (bacterial vaginosis)   4. Candida vaginitis    You are also being treated with Flagyl or Metro if the gel intravaginally as it is very poorly absorbed into the system. The tablets can cause problems with QT prolongation. There are warnings  for QT prolongation with Diflucan. Recommendations are to use the intravaginal miconazole cream to place on the outside and inside as directed. cyto pending    Hayden Rasmussen, NP 10/08/16 1801

## 2016-10-08 NOTE — ED Notes (Signed)
Call back number verified and updated in EPIC... Adv pt to not have SI until lab results comeback neg.... Also adv pt lab results will be on MyChart; instructions given .... Pt verb understanding.   

## 2016-10-09 LAB — HIV ANTIBODY (ROUTINE TESTING W REFLEX): HIV SCREEN 4TH GENERATION: NONREACTIVE

## 2016-10-10 LAB — CERVICOVAGINAL ANCILLARY ONLY
CHLAMYDIA, DNA PROBE: NEGATIVE
Neisseria Gonorrhea: NEGATIVE
Wet Prep (BD Affirm): POSITIVE — AB

## 2016-10-11 ENCOUNTER — Telehealth (HOSPITAL_COMMUNITY): Payer: Self-pay | Admitting: Internal Medicine

## 2016-10-11 MED ORDER — FLUCONAZOLE 150 MG PO TABS: 150.0000 mg | ORAL_TABLET | Freq: Once | ORAL | 0 refills | Status: AC

## 2016-10-11 NOTE — Telephone Encounter (Signed)
Clinical staff, please let patient know that test for candida (yeast) was positive. Will send rx for fluconazole to pharmacy of record, [facility] on [address].   Test for gardnerella (bacterial vaginosis) was also positive.  [facility]  Recheck for further evaluation if symptoms are not improving.  LM

## 2016-10-15 ENCOUNTER — Ambulatory Visit (HOSPITAL_COMMUNITY)
Admission: EM | Admit: 2016-10-15 | Discharge: 2016-10-15 | Disposition: A | Discharge status: Home / Self Care | Payer: Self-pay | Attending: Internal Medicine | Admitting: Internal Medicine

## 2016-10-15 ENCOUNTER — Encounter (HOSPITAL_COMMUNITY): Payer: Self-pay

## 2016-10-15 DIAGNOSIS — B3731 Acute candidiasis of vulva and vagina: Secondary | ICD-10-CM

## 2016-10-15 DIAGNOSIS — Z79899 Other long term (current) drug therapy: Secondary | ICD-10-CM | POA: Insufficient documentation

## 2016-10-15 DIAGNOSIS — F17210 Nicotine dependence, cigarettes, uncomplicated: Secondary | ICD-10-CM | POA: Insufficient documentation

## 2016-10-15 DIAGNOSIS — N390 Urinary tract infection, site not specified: Secondary | ICD-10-CM | POA: Insufficient documentation

## 2016-10-15 DIAGNOSIS — B373 Candidiasis of vulva and vagina: Secondary | ICD-10-CM | POA: Insufficient documentation

## 2016-10-15 DIAGNOSIS — Z202 Contact with and (suspected) exposure to infections with a predominantly sexual mode of transmission: Secondary | ICD-10-CM

## 2016-10-15 LAB — POCT URINALYSIS DIP (DEVICE)
Bilirubin Urine: NEGATIVE
Glucose, UA: 100 mg/dL — AB
HGB URINE DIPSTICK: NEGATIVE
Ketones, ur: NEGATIVE mg/dL
NITRITE: POSITIVE — AB
PH: 6 (ref 5.0–8.0)
PROTEIN: NEGATIVE mg/dL
UROBILINOGEN UA: 0.2 mg/dL (ref 0.0–1.0)

## 2016-10-15 MED ORDER — CEPHALEXIN 500 MG PO CAPS: 500.0000 mg | ORAL_CAPSULE | Freq: Four times a day (QID) | ORAL | 0 refills | Status: DC

## 2016-10-15 MED ORDER — MICONAZOLE NITRATE 2 % VA CREA: 1.0000 | TOPICAL_CREAM | Freq: Every day | VAGINAL | 0 refills | Status: DC

## 2016-10-15 MED ORDER — PHENAZOPYRIDINE HCL 200 MG PO TABS
200.0000 mg | ORAL_TABLET | Freq: Three times a day (TID) | ORAL | 0 refills | Status: DC
Start: 2016-10-15 — End: 2017-04-24

## 2016-10-15 MED ORDER — PHENAZOPYRIDINE HCL 200 MG PO TABS: 200.0000 mg | ORAL_TABLET | Freq: Three times a day (TID) | ORAL | 0 refills | Status: DC

## 2016-10-15 NOTE — ED Triage Notes (Signed)
Patient presents to [facility] with possible UTI and is also concerned about STDs... Reports she had unprotected sex 2 days ago w/new partner, Symptoms today include: burning while urinating and vaginal pain

## 2016-10-15 NOTE — ED Provider Notes (Signed)
CSN: [id number]     Arrival date & time 10/15/16  1810 History   None    Chief Complaint  Patient presents with  . Urinary Tract Infection  . Exposure to STD   (Consider location/radiation/quality/duration/timing/severity/associated sxs/prior Treatment) Patient c/o dysuria.  She is concerned about possible UTI because she has been sexually active w/o protection with a new sex partner.     The history is provided by the patient.  Urinary Tract Infection  Pain quality:  Burning Pain severity:  Mild Onset quality:  Sudden Duration:  2 days Timing:  Constant Progression:  Waxing and waning Chronicity:  New Recent urinary tract infections: no   Relieved by:  None tried Worsened by:  Nothing Ineffective treatments:  None tried Associated symptoms: vaginal discharge   Exposure to STD     Past Medical History:  Diagnosis Date  . Long Q-T syndrome   . Polysubstance abuse    a. 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 ? Needs INH.  . Tobacco abuse    History reviewed. No pertinent surgical history. Family History  Problem Relation Age of Onset  . CAD      Grandmother's father  . Other      Mother was worked up remotely for ?parasympathetic nervous system problem but cardiac workup was reportedly unremarkable   Social History  Substance Use Topics  . Smoking status: Current Every Day Smoker    Packs/day: 2.00  . Smokeless tobacco: Never Used     Comment: Smoker since age 48  . Alcohol use No   OB History    No data available     Review of Systems  Constitutional: Negative.   HENT: Negative.   Eyes: Negative.   Respiratory: Negative.   Cardiovascular: Negative.   Gastrointestinal: Negative.   Endocrine: Negative.   Genitourinary: Positive for dysuria and vaginal discharge.  Musculoskeletal: Negative.   Allergic/Immunologic: Negative.   Neurological:  Negative.   Hematological: Negative.   Psychiatric/Behavioral: Negative.     Allergies  Iodinated diagnostic agents; Clindamycin/lincomycin; and Sulfa antibiotics  Home Medications   Prior to Admission medications   Medication Sig Start Date End Date Taking? Authorizing Provider  atenolol (TENORMIN) 25 MG tablet TAKE ONE TABLET BY MOUTH ONCE DAILY. MUST HAVE OFFICE VISIT BEFORE FURTHER REFILLS. 06/15/16  Yes Marinus Maw, MD  METHADONE HCL PO Take 20 mLs by mouth daily.    Yes Historical Provider, MD  metroNIDAZOLE (METROGEL) 0.75 % vaginal gel 1 applicator full pv bid x 5 days 10/08/16  Yes Hayden Rasmussen, NP  cephALEXin (KEFLEX) 500 MG capsule Take 1 capsule (500 mg total) by mouth 4 (four) times daily. 10/15/16   Deatra Canter, FNP  miconazole (MONISTAT 7 SIMPLY CURE) 2 % vaginal cream Place 1 Applicatorful vaginally at bedtime. 10/15/16   Deatra Canter, FNP  phenazopyridine (PYRIDIUM) 200 MG tablet Take 1 tablet (200 mg total) by mouth 3 (three) times daily. 10/15/16   Deatra Canter, FNP   Meds Ordered and Administered this Visit  Medications - No data to display  BP 105/63 (BP Location: Right Arm)   Pulse 70   Temp 98.6 F (37 C) (Oral)   Resp 16   LMP 09/24/2016 (Exact Date)   SpO2 100%  No data found.   Physical Exam  Constitutional: She is oriented to person, place, and time. She appears well-developed  and well-nourished.  HENT:  Head: Normocephalic and atraumatic.  Eyes: Conjunctivae and EOM are normal. Pupils are equal, round, and reactive to light.  Neck: Normal range of motion. Neck supple.  Cardiovascular: Normal rate, regular rhythm and normal heart sounds.   Pulmonary/Chest: Effort normal and breath sounds normal.  Genitourinary: Vaginal discharge found.  Genitourinary Comments: BUS - Wnl Vagina - White curdish DC Cervix - No CMt Adnexa - No adnexal tenderness bilateral.  Neurological: She is alert and oriented to person, place, and time.  Nursing note  and vitals reviewed.   Urgent Care Course     Procedures (including critical care time)  Labs Review Labs Reviewed  POCT URINALYSIS DIP (DEVICE) - Abnormal; Notable for the following:       Result Value   Glucose, UA 100 (*)    Nitrite POSITIVE (*)    Leukocytes, UA TRACE (*)    All other components within normal limits  URINE CULTURE  CERVICOVAGINAL ANCILLARY ONLY    Imaging Review No results found.   Visual Acuity Review  Right Eye Distance:   Left Eye Distance:   Bilateral Distance:    Right Eye Near:   Left Eye Near:    Bilateral Near:         MDM   1. Urinary tract infection without hematuria, site unspecified   2. Vaginal candidiasis   3. Possible exposure to STD    Keflex  one po qid x 7 days #28 Pyridium  one po tid x 2days Monistat 1 applicator qhs x 7 days #45 grams  Ua and ua cx Endocervical Cytology - GC/ Chlamydia BD affirm      Deatra Canter, FNP 10/15/16 2012

## 2016-10-17 LAB — CERVICOVAGINAL ANCILLARY ONLY
Chlamydia: NEGATIVE
Neisseria Gonorrhea: NEGATIVE
Wet Prep (BD Affirm): NEGATIVE

## 2016-10-18 LAB — URINE CULTURE: Culture: >=100000 — AB

## 2017-03-28 ENCOUNTER — Ambulatory Visit: Payer: Self-pay

## 2017-04-13 ENCOUNTER — Encounter (HOSPITAL_COMMUNITY): Payer: Self-pay | Admitting: Emergency Medicine

## 2017-04-13 ENCOUNTER — Ambulatory Visit (HOSPITAL_COMMUNITY)
Admission: EM | Admit: 2017-04-13 | Discharge: 2017-04-13 | Disposition: A | Discharge status: Home / Self Care | Payer: Self-pay | Attending: Family Medicine | Admitting: Family Medicine

## 2017-04-13 DIAGNOSIS — F17210 Nicotine dependence, cigarettes, uncomplicated: Secondary | ICD-10-CM | POA: Insufficient documentation

## 2017-04-13 DIAGNOSIS — B9689 Other specified bacterial agents as the cause of diseases classified elsewhere: Secondary | ICD-10-CM

## 2017-04-13 DIAGNOSIS — N760 Acute vaginitis: Secondary | ICD-10-CM | POA: Insufficient documentation

## 2017-04-13 MED ORDER — METRONIDAZOLE 0.75 % VA GEL: VAGINAL | 0 refills | Status: DC

## 2017-04-13 NOTE — ED Triage Notes (Signed)
Seen  by provider only

## 2017-04-14 LAB — URINE CYTOLOGY ANCILLARY ONLY
CHLAMYDIA, DNA PROBE: NEGATIVE
NEISSERIA GONORRHEA: NEGATIVE
TRICH (WINDOWPATH): NEGATIVE

## 2017-04-17 LAB — URINE CYTOLOGY ANCILLARY ONLY
Bacterial vaginitis: POSITIVE — AB
Candida vaginitis: NEGATIVE

## 2017-04-18 NOTE — ED Provider Notes (Signed)
[facility]   [id number] 04/13/17 Arrival Time: 1026  ASSESSMENT & PLAN:  Today you were diagnosed with the following: 1. BV (bacterial vaginosis)     Meds ordered this encounter  Medications  . metroNIDAZOLE (METROGEL) 0.75 % vaginal gel    Sig: 1 applicator full pv bid x 5 days    Dispense:  70 g    Refill:  0   Urine cytology sent. F/U as needed. Reviewed expectations re: course of current medical issues. Questions answered. Outlined signs and symptoms indicating need for more acute intervention. Patient verbalized understanding. After Visit Summary given.   SUBJECTIVE:  [name] is a 36 y.o. female who presents with complaint of vaginal discharge for a few days. Describes discharge as thin. Onset gradual. Urinary symptoms: none. Afebrile. No abdominal or pelvic pain. No n/v. No rashes or lesions. Sexually active with single female partner(s). OTC treatment: None. History of similar symptoms: Yes; similar to BV in the past.  No LMP recorded.  ROS: As per HPI.  OBJECTIVE:  Vitals:   04/13/17 1149  BP: (!) 84/54  Pulse: 76  Resp: 18  Temp: 98.8 F (37.1 C)  TempSrc: Oral  SpO2: 96%    General appearance: alert, cooperative, appears stated age and no distress Throat: lips, mucosa, and tongue normal; teeth and gums normal Back: no CVA tenderness Abdomen: soft, non-tender; bowel sounds normal; no masses or organomegaly; no guarding or rebound tenderness GU: declines Skin: warm and dry Psychological:  Alert and cooperative. Normal mood and affect.  Results for orders placed or performed during the hospital encounter of 04/13/17  Urine cytology ancillary only  Result Value Ref Range   Chlamydia Negative    Neisseria gonorrhea Negative    Trichomonas Negative   Urine cytology ancillary only  Result Value Ref Range   Bacterial vaginitis **POSITIVE for Atopobium vaginae** (A)    Candida vaginitis Negative for Candida Vaginitis Microorganisms       Labs Reviewed  URINE CYTOLOGY ANCILLARY ONLY - Abnormal; Notable for the following:       Result Value   Bacterial vaginitis **POSITIVE for Atopobium vaginae** (*)    All other components within normal limits  URINE CYTOLOGY ANCILLARY ONLY     Allergies  Allergen Reactions  . Iodinated Diagnostic Agents Shortness Of Breath and Palpitations    CTA chest PE done 08/2014. Pt had SOB, chest heaviness, near syncope after contrast was administered. (Pt is unable to have Epinephrine due to cardiac issue)  . Clindamycin/Lincomycin     palpitations  . Sulfa Antibiotics Nausea Only    Past Medical History:  Diagnosis Date  . Long Q-T syndrome   . Polysubstance abuse    a. 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 ? Needs INH.  . Tobacco abuse    Family History  Problem Relation Age of Onset  . CAD Unknown        Grandmother's father  . Other Unknown        Mother was worked up remotely for ?parasympathetic nervous system problem but cardiac workup was reportedly unremarkable   Social History   Social History  . Marital status: Single    Spouse name: N/A  . Number of children: N/A  . Years of education: N/A   Occupational History  . Not on file.   Social History Main Topics  . Smoking status: Current  Every Day Smoker    Packs/day: 2.00  . Smokeless tobacco: Never Used     Comment: Smoker since age 47  . Alcohol use No  . Drug use: No     Comment: Remote history of opiates, heroin - clean for 8 years   . Sexual activity: Yes    Birth control/ protection: None   Other Topics Concern  . Not on file   Social History Narrative  . No narrative on file          Mardella Layman, MD 04/18/17 [phone]

## 2017-04-24 ENCOUNTER — Ambulatory Visit (INDEPENDENT_AMBULATORY_CARE_PROVIDER_SITE_OTHER): Payer: Self-pay | Admitting: Advanced Practice Midwife

## 2017-04-24 ENCOUNTER — Encounter: Payer: Self-pay | Admitting: Advanced Practice Midwife

## 2017-04-24 VITALS — BP 119/70 | HR 77 | Ht 59.0 in | Wt 101.5 lb

## 2017-04-24 DIAGNOSIS — T192XXA Foreign body in vulva and vagina, initial encounter: Secondary | ICD-10-CM

## 2017-04-24 DIAGNOSIS — N898 Other specified noninflammatory disorders of vagina: Secondary | ICD-10-CM

## 2017-04-24 DIAGNOSIS — F3281 Premenstrual dysphoric disorder: Secondary | ICD-10-CM

## 2017-04-24 DIAGNOSIS — N83202 Unspecified ovarian cyst, left side: Secondary | ICD-10-CM

## 2017-04-24 MED ORDER — SERTRALINE HCL 50 MG PO TABS: 50.0000 mg | ORAL_TABLET | Freq: Every day | ORAL | 1 refills | Status: DC

## 2017-04-24 NOTE — Progress Notes (Signed)
Subjective:     Patient ID: [name], female   DOB: 05-Feb-1981, 36 y.o.   MRN: [id number]  [name] is a 36 y.o. G3P0030 Who presents today with vaginal discharge. She was treated for BV at the end of September, and she feels like it never went away. She also reports that for about 2 years she has noticed a lot of "hormonal changes" including: fatigue, weakness, mood changes. She reports that about one week before her cycle she has heightened sense of paranoia and mood changes. Then once her cycle is over these symptoms resolve. She has had thyroid testing with PCP, normal pap. She also had pelvic US and found a cyst, but on follow up it was stable/not concerning.    Vaginal Discharge  The patient's primary symptoms include vaginal discharge. This is a new problem. The current episode started 1 to 4 weeks ago. The problem occurs constantly. The problem has been unchanged. Pain severity now: 8/10. The problem affects both sides. She is not pregnant. The vaginal discharge was malodorous (discharge seems normal, more of a concern about odor. ). There has been no bleeding. Nothing aggravates the symptoms. Treatments tried: metronidazole  The treatment provided mild relief. She is sexually active. She uses nothing (Not planning a pregnancy at this time, but it open to it in the future. ) for contraception. Her menstrual history has been regular (LMP: 04/08/17 ).     Review of Systems  Genitourinary: Positive for vaginal discharge.      Objective:   Physical Exam  Constitutional: She is oriented to person, place, and time. She appears well-developed and well-nourished. No distress.  HENT:  Head: Normocephalic.  Cardiovascular: Normal rate.   Pulmonary/Chest: Effort normal.  Abdominal: Soft. There is no tenderness. There is no rebound.  Genitourinary:  Genitourinary Comments:  External: no lesion Vagina: small amount of white discharge. Tampon retained in vagina  Cervix: pink,  smooth, no CMT Uterus: NSSC Adnexa: NT   Neurological: She is alert and oriented to person, place, and time.  Skin: Skin is warm and dry.  Psychiatric: She has a normal mood and affect.  Nursing note and vitals reviewed.     Assessment:     1. Cyst of left ovary   2. Retained foreign body of vagina, initial encounter   3. Premenstrual dysphoric disorder        Plan:     Start Zoloft  as directed with cycles Patient is established with [facility], and they can manage zoloft from here Sent swab for BV, patient informed this is unlikely as the odor is most likely from retained tampon.

## 2017-04-24 NOTE — Patient Instructions (Signed)
Premenstrual Syndrome Premenstrual syndrome (PMS) is a group of physical, emotional, and behavioral symptoms that affect women of childbearing age. PMS starts 1-2 weeks before the start of a woman's period and goes away a few days after the period starts. It often recurs in a predictable pattern. PMS can range from mild to severe. When it is severe, it is called premenstrual dysphoric disorder (PMDD). PMS can interfere in many ways with normal daily activities. What are the causes? The cause of this condition is not known, but it seems to be related to hormone changes that happen before menstruation. What are the signs or symptoms? Symptoms of this condition often happen every month. They go away completely after your period starts. Physical symptoms include:  Bloating.  Breast pain.  Headaches.  Extreme fatigue.  Backaches.  Swelling of the hands and feet.  Weight gain.  Hot flashes.  Emotional and behavioral symptoms include:  Mood swings.  Depression.  Angry outbursts.  Irritability.  Anxiety.  Crying spells.  Food cravings or appetite changes.  Changes in sexual desire.  Confusion.  Aggression.  Social withdrawal.  Poor concentration.  How is this diagnosed? This condition is diagnosed if symptoms of PMS:  Are present in the 5 days before your period starts.  End within 4 days after your period starts.  Happen at least 3 months in a row.  Interfere with some of your normal activities.  Other conditions that can cause some of these symptoms must be ruled out before PMS can be diagnosed. How is this treated? This condition may be treated by:  Maintaining a healthy lifestyle. This includes eating a balanced diet and exercising regularly.  Taking medicines. Medicines can help relieve symptoms such as cramps, aches, pains, headaches, and breast tenderness. Depending on the severity of the condition, your health care provider may  recommend: ? Over-the-counter pain medicines. ? Prescription medicines for PMDD.  Follow these instructions at home: Eating and drinking   Eat a well-balanced diet.  Avoid caffeine and alcohol.  Limit the amount of salt and salty foods you eat. This will help lessen bloating.  Drink enough fluid to keep your urine clear or pale yellow.  Take a multivitamin if told to by your health care provider. Lifestyle  Do not use any tobacco products, such as cigarettes, chewing tobacco, and e-cigarettes. If you need help quitting, ask your health care provider.  Exercise regularly as suggested by your health care provider.  Get enough sleep.  Practice relaxation techniques.  Limit stress. Other Instructions  For 2-3 months, write down your symptoms, their severity, and how long they last. This will help your health care provider choose the best treatment for you.  Take over-the-counter and prescription medicines only as told by your health care provider.  If you are using oral contraceptive pills, use them as told by your health care provider. This information is not intended to replace advice given to you by your health care provider. Make sure you discuss any questions you have with your health care provider. Document Released: 07/01/2000 Document Revised: 08/05/2015 Document Reviewed: 04/03/2015 Elsevier Interactive Patient Education  2018 [employer]  

## 2017-04-25 LAB — CERVICOVAGINAL ANCILLARY ONLY: Bacterial vaginitis: NEGATIVE

## 2017-04-28 ENCOUNTER — Ambulatory Visit (HOSPITAL_COMMUNITY): Payer: Self-pay | Attending: Advanced Practice Midwife

## 2017-05-02 ENCOUNTER — Ambulatory Visit (HOSPITAL_COMMUNITY)
Admission: EM | Admit: 2017-05-02 | Discharge: 2017-05-02 | Disposition: A | Discharge status: Home / Self Care | Payer: Self-pay | Attending: Physician Assistant | Admitting: Physician Assistant

## 2017-05-02 ENCOUNTER — Encounter (HOSPITAL_COMMUNITY): Payer: Self-pay | Admitting: Emergency Medicine

## 2017-05-02 DIAGNOSIS — F419 Anxiety disorder, unspecified: Secondary | ICD-10-CM

## 2017-05-02 HISTORY — DX: Premenstrual dysphoric disorder: F32.81

## 2017-05-02 MED ORDER — BUSPIRONE HCL 10 MG PO TABS: 10.0000 mg | ORAL_TABLET | Freq: Three times a day (TID) | ORAL | 0 refills | Status: DC

## 2017-05-02 NOTE — ED Triage Notes (Signed)
PT was diagnosed with PMDD 1 week ago. PT reports for several months she has been experiencing severe anxiety, paranoia, fatigue, and depression with her menstrual cycles. PT reports she started zoloft 9/21 and recently doubled her dose but it isn't helping. PT was seen at [facility] hospital last week.   PT denies suicidal / homicidal thoughts.

## 2017-05-02 NOTE — ED Provider Notes (Signed)
[facility]    CSN: [id number] Arrival date & time: 05/02/17  1518     History   Chief Complaint Chief Complaint  Patient presents with  . Anxiety    HPI [name] is a 36 y.o. female. Has a history of prolonged QT.  Tells me that she was recently started on an SSRI about two weeks ago and is not seeing any benefits yet.  Has a history of polysubstance abuse that include IV drug use however this is in remission for 8 years per [facility]. Denies any alcohol at this time and tells me that this dose make her feel psychotic.   The history is provided by the patient.  Anxiety  This is a chronic problem. The current episode started more than 1 week ago. The problem has been gradually worsening.    Past Medical History:  Diagnosis Date  . Long Q-T syndrome   . PMDD (premenstrual dysphoric disorder)   . Polysubstance abuse (HCC)    a. 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 ? Needs INH.  . Tobacco abuse     Patient Active Problem List   Diagnosis Date Noted  . Right ovarian cyst 04/19/2016  . Methadone maintenance therapy patient (HCC) 03/28/2016  . Abdominal pain, left lower quadrant 03/28/2016  . Tobacco abuse 03/02/2016  . Palpitations 08/21/2013  . Prolonged Q-T interval on ECG 01/28/2013    History reviewed. No pertinent surgical history.  OB History    No data available       Home Medications    Prior to Admission medications   Medication Sig Start Date End Date Taking? Authorizing Provider  METHADONE HCL PO Take 10 mLs by mouth daily.    Yes [provider]  sertraline (ZOLOFT) 50 MG tablet Take 1 tablet (50 mg total) by mouth daily. Start with 1/2 tab for first cycle, and then increase to 1 tab for next cycle if needed. 04/24/17  Yes Thressa Sheller D, CNM  busPIRone (BUSPAR) 10 MG tablet Take 1 tablet (10 mg total) by mouth 3  (three) times daily. 05/02/17   Ofilia Neas, PA-C    Family History Family History  Problem Relation Age of Onset  . CAD Unknown        Grandmother's father  . Other Unknown        Mother was worked up remotely for ?parasympathetic nervous system problem but cardiac workup was reportedly unremarkable    Social History Social History  Substance Use Topics  . Smoking status: Current Every Day Smoker    Packs/day: 2.00  . Smokeless tobacco: Never Used     Comment: Smoker since age 20  . Alcohol use No     Allergies   Iodinated diagnostic agents; Clindamycin/lincomycin; and Sulfa antibiotics   Review of Systems Review of Systems   Physical Exam Triage Vital Signs ED Triage Vitals  Enc Vitals Group     BP 05/02/17 1545 97/82     Pulse Rate 05/02/17 1545 94     Resp 05/02/17 1545 16     Temp 05/02/17 1545 98.7 F (37.1 C)     Temp Source 05/02/17 1545 Oral     SpO2 05/02/17 1545 99 %     Weight 05/02/17 1546 102 lb (46.3 kg)     Height 05/02/17 1546  (1.499 m)     Head Circumference --  Peak Flow --      Pain Score 05/02/17 1546 0     Pain Loc --      Pain Edu? --      Excl. in GC? --    No data found.   Updated Vital Signs BP 97/82   Pulse 94   Temp 98.7 F (37.1 C) (Oral)   Resp 16   Ht  (1.499 m)   Wt 102 lb (46.3 kg)   LMP 05/01/2017   SpO2 99%   BMI 20.60 kg/m      Physical Exam  Constitutional: She is oriented to person, place, and time. She appears well-developed.  Eyes: Pupils are equal, round, and reactive to light. EOM are normal.  Cardiovascular: Normal rate.   Pulmonary/Chest: Effort normal.  Abdominal: She exhibits no distension.  Musculoskeletal: Normal range of motion.  Neurological: She is alert and oriented to person, place, and time. No cranial nerve deficit.  Skin: Skin is warm and dry. She is not diaphoretic.  Psychiatric: She has a normal mood and affect. Her behavior is normal. Judgment and thought  content normal.  Vitals reviewed.    UC Treatments / Results  Labs (all labs ordered are listed, but only abnormal results are displayed) Labs Reviewed - No data to display  EKG  EKG Interpretation None       Radiology No results found.  Procedures Procedures (including critical care time)  Medications Ordered in UC Medications - No data to display   Initial Impression / Assessment and Plan / UC Course  I have reviewed the triage vital signs and the nursing notes.  Pertinent labs & imaging results that were available during my care of the patient were reviewed by me and considered in my medical decision making (see chart for details).    Final Clinical Impressions(s) / UC Diagnoses   Final diagnoses:  Anxiety   She is a challenging patient to treat given her history of polysubstance abuse and prolonged QT (which was less than 470 on both of her most previous EKGs)  Fortunately buspar does no cary the risk of prolonging the QT per the literature. Will try ten mg every 8 hours prn.  I hope it helps her.   New Prescriptions New Prescriptions   BUSPIRONE (BUSPAR) 10 MG TABLET    Take 1 tablet (10 mg total) by mouth 3 (three) times daily.      Ofilia Neas, PA-C 05/02/17 [phone]

## 2017-05-02 NOTE — Discharge Instructions (Signed)
Lets try some nortryptaline.  Start with

## 2017-08-23 ENCOUNTER — Encounter: Payer: Self-pay | Admitting: Physician Assistant

## 2017-08-28 NOTE — Progress Notes (Deleted)
Cardiology Office Note Date:  08/28/2017  Patient ID:  [name], DOB 01-Jul-1981, MRN [id number] PCP:  Patient, No Pcp Per  Electrophysiologist:  Dr. Ladona Ridgelaylor  ***refresh   Chief Complaint: *** ???  History of Present Illness: [name] is a 37 y.o. female with history of methadone induced long QT, hx of opiate and heroin abuse, smoking,   She comes today to be seen for Dr. Ladona Ridgelaylor.  He last saw her Aug 2017, at that time her QT was ok at the current dose of methadone, she was c/o fatigue, and instructed to wean off her atenolol, have a f/u EKG done to recheck QT off BB not done, though BB is off her med list.  (no further EKGs have been done in our system).  ER visits: Jan 2018 dysuria March 2018 STD exposure March 2018, urinary sx, STD exposure Sept 2018 bacterial vaginosis Oct 2018 Anxiety  *** Syncope? *** QT *** meds *** drugs? *** smoking? *** pregnant?? Last 09/2016 was neg  Past Medical History:  Diagnosis Date  . Long Q-T syndrome   . PMDD (premenstrual dysphoric disorder)   . Polysubstance abuse (HCC)    a. 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  Marland Kitchen. 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 ? Needs INH.  . Tobacco abuse     No past surgical history on file.  Current Outpatient Medications  Medication Sig Dispense Refill  . busPIRone (BUSPAR) 10 MG tablet Take 1 tablet (10 mg total) by mouth 3 (three) times daily. 45 tablet 0  . METHADONE HCL PO Take 10 mLs by mouth daily.     . sertraline (ZOLOFT) 50 MG tablet Take 1 tablet (50 mg total) by mouth daily. Start with 1/2 tab for first cycle, and then increase to 1 tab for next cycle if needed. 30 tablet 1   No current facility-administered medications for this visit.     Allergies:   Iodinated diagnostic agents; Clindamycin/lincomycin; and Sulfa antibiotics   Social History:  The patient  reports that she has been smoking.  She has  been smoking about 2.00 packs per day. she has never used smokeless tobacco. She reports that she does not drink alcohol or use drugs.   Family History:  The patient's family history includes CAD in her unknown relative; Other in her unknown relative.  ROS:  Please see the history of present illness.   All other systems are reviewed and otherwise negative.   PHYSICAL EXAM: *** VS:  There were no vitals taken for this visit. BMI: There is no height or weight on file to calculate BMI. Well nourished, well developed, in no acute distress  HEENT: normocephalic, atraumatic  Neck: no JVD, carotid bruits or masses Cardiac:  *** RRR; no significant murmurs, no rubs, or gallops Lungs:  *** CTA b/l, no wheezing, rhonchi or rales  Abd: soft, nontender MS: no deformity or *** atrophy Ext: *** no edema  Skin: warm and dry, no rash Neuro:  No gross deficits appreciated Psych: euthymic mood, full affect   EKG:  Done today and reviewed by myself ***  05/05/15: ETT  There was no ST segment deviation noted during stress. ETT with good exercise tolerance (11:00); no chest pain, normal BP response; no ST changes; 4 beats NSVT with exercise; negative adequate ETT.  01/28/13: TTE Study Conclusions Left ventricle: The cavity size was normal. Systolic function was normal. The estimated ejection fraction was  in the range of 55% to 60%. Wall motion was normal; there were no regional wall motion abnormalities.   Recent Labs: No results found for requested labs within last 8760 hours.  No results found for requested labs within last 8760 hours.   CrCl cannot be calculated (Patient's most recent lab result is older than the maximum 21 days allowed.).   Wt Readings from Last 3 Encounters:  05/02/17 102 lb (46.3 kg)  04/24/17 101 lb 8 oz (46 kg)  05/16/16 118 lb (53.5 kg)     Other studies reviewed: Additional studies/records reviewed today include: summarized above  ASSESSMENT AND PLAN:  1.  Methadone induced Long QT     ***  2. Substance abuse     ***  Disposition: F/u with ***  Current medicines are reviewed at length with the patient today.  The patient did not have any concerns regarding medicines.***  Signed, Francis Dowse, PA-C 08/28/2017 8:20 AM     [facility] [address] [place] [address] [phone] (office)  [phone] (fax)

## 2017-08-30 ENCOUNTER — Ambulatory Visit: Payer: Self-pay | Admitting: Physician Assistant

## 2017-09-12 ENCOUNTER — Other Ambulatory Visit: Payer: Self-pay

## 2017-09-12 ENCOUNTER — Ambulatory Visit (HOSPITAL_COMMUNITY)
Admission: EM | Admit: 2017-09-12 | Discharge: 2017-09-12 | Disposition: A | Discharge status: Home / Self Care | Payer: Self-pay | Attending: Family Medicine | Admitting: Family Medicine

## 2017-09-12 ENCOUNTER — Encounter (HOSPITAL_COMMUNITY): Payer: Self-pay | Admitting: Emergency Medicine

## 2017-09-12 ENCOUNTER — Encounter: Payer: Self-pay | Admitting: Physician Assistant

## 2017-09-12 DIAGNOSIS — J0140 Acute pansinusitis, unspecified: Secondary | ICD-10-CM

## 2017-09-12 MED ORDER — FLUTICASONE PROPIONATE 50 MCG/ACT NA SUSP: 2.0000 | Freq: Every day | NASAL | 0 refills | Status: DC

## 2017-09-12 MED ORDER — MELOXICAM 7.5 MG PO TABS: 7.5000 mg | ORAL_TABLET | Freq: Every day | ORAL | 0 refills | Status: DC

## 2017-09-12 MED ORDER — PREDNISONE 20 MG PO TABS: 40.0000 mg | ORAL_TABLET | Freq: Every day | ORAL | 0 refills | Status: AC

## 2017-09-12 MED ORDER — IPRATROPIUM BROMIDE 0.06 % NA SOLN: 2.0000 | Freq: Four times a day (QID) | NASAL | 0 refills | Status: DC

## 2017-09-12 MED ORDER — AMOXICILLIN-POT CLAVULANATE 875-125 MG PO TABS: 1.0000 | ORAL_TABLET | Freq: Two times a day (BID) | ORAL | 0 refills | Status: DC

## 2017-09-12 NOTE — ED Provider Notes (Signed)
[facility]    CSN: [id number] Arrival date & time: 09/12/17  1710     History   Chief Complaint Chief Complaint  Patient presents with  . URI    HPI [name] is a 37 y.o. female.   37 year old female with history of long QT syndrome, polysubstance abuse comes in for 10-day history of URI symptoms. Sneezing, rhinorrhea, nasal congestion, post nasal drip, cough. Subjective fevers at night. otc antihistamine, aspirin without relief. States thought it was allergies, but symptoms first started after contact with friend who has similar symptoms. Current every day smoker, 0.5ppd, down for 2.5ppd (for around 15 years), 20 years.       Past Medical History:  Diagnosis Date  . Long Q-T syndrome   . PMDD (premenstrual dysphoric disorder)   . Polysubstance abuse (HCC)    a. 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 ? Needs INH.  . Tobacco abuse     Patient Active Problem List   Diagnosis Date Noted  . Right ovarian cyst 04/19/2016  . Methadone maintenance therapy patient (HCC) 03/28/2016  . Abdominal pain, left lower quadrant 03/28/2016  . Tobacco abuse 03/02/2016  . Palpitations 08/21/2013  . Prolonged Q-T interval on ECG 01/28/2013    History reviewed. No pertinent surgical history.  OB History    No data available       Home Medications    Prior to Admission medications   Medication Sig Start Date End Date Taking? Authorizing Provider  METHADONE HCL PO Take 10 mLs by mouth daily.    Yes [provider]  amoxicillin-clavulanate (AUGMENTIN) 875-125 MG tablet Take 1 tablet by mouth every 12 (twelve) hours. 09/15/17   Cathie Hoops, Ervie Mccard V, PA-C  busPIRone (BUSPAR) 10 MG tablet Take 1 tablet (10 mg total) by mouth 3 (three) times daily. 05/02/17   Ofilia Neas, PA-C  fluticasone (FLONASE) 50 MCG/ACT nasal spray Place 2 sprays into both nostrils  daily. 09/12/17   Cathie Hoops, Loza Prell V, PA-C  ipratropium (ATROVENT) 0.06 % nasal spray Place 2 sprays into both nostrils 4 (four) times daily. 09/12/17   Cathie Hoops, Elleen Coulibaly V, PA-C  meloxicam (MOBIC) 7.5 MG tablet Take 1 tablet (7.5 mg total) by mouth daily. 09/12/17   Cathie Hoops, Nyrah Demos V, PA-C  predniSONE (DELTASONE) 20 MG tablet Take 2 tablets (40 mg total) by mouth daily for 3 days. 09/12/17 09/15/17  Cathie Hoops, Brigetta Beckstrom V, PA-C  sertraline (ZOLOFT) 50 MG tablet Take 1 tablet (50 mg total) by mouth daily. Start with 1/2 tab for first cycle, and then increase to 1 tab for next cycle if needed. 04/24/17   Armando Reichert, CNM    Family History Family History  Problem Relation Age of Onset  . CAD Unknown        Grandmother's father  . Other Unknown        Mother was worked up remotely for ?parasympathetic nervous system problem but cardiac workup was reportedly unremarkable    Social History Social History   Tobacco Use  . Smoking status: Current Every Day Smoker    Packs/day: 2.00  . Smokeless tobacco: Never Used  . Tobacco comment: Smoker since age 18  Substance Use Topics  . Alcohol use: No  . Drug use: No    Comment: Remote history of opiates, heroin - clean for 8 years      Allergies   Iodinated  diagnostic agents; Clindamycin/lincomycin; and Sulfa antibiotics   Review of Systems Review of Systems  Reason unable to perform ROS: See HPI as above.     Physical Exam Triage Vital Signs ED Triage Vitals  Enc Vitals Group     BP 09/12/17 1804 99/66     Pulse Rate 09/12/17 1804 80     Resp --      Temp 09/12/17 1804 99 F (37.2 C)     Temp Source 09/12/17 1804 Oral     SpO2 09/12/17 1804 100 %     Weight --      Height --      Head Circumference --      Peak Flow --      Pain Score 09/12/17 1801 10     Pain Loc --      Pain Edu? --      Excl. in GC? --    No data found.  Updated Vital Signs BP 99/66 (BP Location: Left Arm)   Pulse 80   Temp 99 F (37.2 C) (Oral)   LMP 08/22/2017 (Approximate)   SpO2  100%   Physical Exam  Constitutional: She is oriented to person, place, and time. She appears well-developed and well-nourished. No distress.  HENT:  Head: Normocephalic and atraumatic.  Right Ear: Tympanic membrane, external ear and ear canal normal. Tympanic membrane is not erythematous and not bulging.  Left Ear: Tympanic membrane, external ear and ear canal normal. Tympanic membrane is not erythematous and not bulging.  Nose: Mucosal edema present. Right sinus exhibits maxillary sinus tenderness and frontal sinus tenderness. Left sinus exhibits maxillary sinus tenderness and frontal sinus tenderness.  Mouth/Throat: Uvula is midline, oropharynx is clear and moist and mucous membranes are normal.  Eyes: Conjunctivae are normal. Pupils are equal, round, and reactive to light.  Neck: Normal range of motion. Neck supple.  Cardiovascular: Normal rate, regular rhythm and normal heart sounds. Exam reveals no gallop and no friction rub.  No murmur heard. Pulmonary/Chest: Effort normal and breath sounds normal. She has no decreased breath sounds. She has no wheezes. She has no rhonchi. She has no rales.  Lymphadenopathy:    She has no cervical adenopathy.  Neurological: She is alert and oriented to person, place, and time.  Skin: Skin is warm and dry.  Psychiatric: She has a normal mood and affect. Her behavior is normal. Judgment normal.     UC Treatments / Results  Labs (all labs ordered are listed, but only abnormal results are displayed) Labs Reviewed - No data to display  EKG  EKG Interpretation None       Radiology No results found.  Procedures Procedures (including critical care time)  Medications Ordered in UC Medications - No data to display   Initial Impression / Assessment and Plan / UC Course  I have reviewed the triage vital signs and the nursing notes.  Pertinent labs & imaging results that were available during my care of the patient were reviewed by me and  considered in my medical decision making (see chart for details).    Augmentin for sinusitis.  Prednisone for sinus pressure.  Other symptomatic treatment discussed.  Push fluids.  Patient requesting medication for pain, Rx of Mobic sent to pharmacy.  Return precautions given.  Patient expresses understanding and agrees to plan.  Final Clinical Impressions(s) / UC Diagnoses   Final diagnoses:  Acute non-recurrent pansinusitis    ED Discharge Orders        Ordered  amoxicillin-clavulanate (AUGMENTIN) 875-125 MG tablet  Every 12 hours,   Status:  Discontinued     09/12/17 1859    amoxicillin-clavulanate (AUGMENTIN) 875-125 MG tablet  Every 12 hours     09/12/17 1914    predniSONE (DELTASONE) 20 MG tablet  Daily     09/12/17 1859    ipratropium (ATROVENT) 0.06 % nasal spray  4 times daily     09/12/17 1859    fluticasone (FLONASE) 50 MCG/ACT nasal spray  Daily     09/12/17 1859    meloxicam (MOBIC) 7.5 MG tablet  Daily     09/12/17 1859        Belinda Fisher, PA-C 09/12/17 1919

## 2017-09-12 NOTE — Discharge Instructions (Signed)
Prednisone for sinus pressure. Mobic for pain. Start flonase, atrovent nasal spray for nasal congestion/drainage. You can use over the counter nasal saline rinse such as neti pot for nasal congestion. Keep hydrated, your urine should be clear to pale yellow in color. Tylenol/motrin for fever and pain. Monitor for any worsening of symptoms, chest pain, shortness of breath, wheezing, swelling of the throat, follow up for reevaluation.   Start Augmentin on 3/1 if symptoms not improving.

## 2017-09-12 NOTE — ED Triage Notes (Signed)
Pt reports cold symptoms for 8 days.  In the last few days she states her sinuses have been congested.

## 2018-07-11 ENCOUNTER — Encounter (HOSPITAL_COMMUNITY): Payer: Self-pay | Admitting: Emergency Medicine

## 2018-07-11 ENCOUNTER — Emergency Department (HOSPITAL_COMMUNITY): Payer: Self-pay

## 2018-07-11 ENCOUNTER — Emergency Department (HOSPITAL_COMMUNITY)
Admission: EM | Admit: 2018-07-11 | Discharge: 2018-07-11 | Disposition: A | Discharge status: Home / Self Care | Payer: Self-pay | Attending: Emergency Medicine | Admitting: Emergency Medicine

## 2018-07-11 ENCOUNTER — Other Ambulatory Visit: Payer: Self-pay

## 2018-07-11 DIAGNOSIS — M62838 Other muscle spasm: Secondary | ICD-10-CM | POA: Insufficient documentation

## 2018-07-11 DIAGNOSIS — Z79899 Other long term (current) drug therapy: Secondary | ICD-10-CM | POA: Insufficient documentation

## 2018-07-11 DIAGNOSIS — R0781 Pleurodynia: Secondary | ICD-10-CM | POA: Insufficient documentation

## 2018-07-11 DIAGNOSIS — F17200 Nicotine dependence, unspecified, uncomplicated: Secondary | ICD-10-CM | POA: Insufficient documentation

## 2018-07-11 DIAGNOSIS — R0602 Shortness of breath: Secondary | ICD-10-CM

## 2018-07-11 LAB — CBC WITH DIFFERENTIAL/PLATELET
Abs Immature Granulocytes: 0.02 10*3/uL (ref 0.00–0.07)
BASOS ABS: 0 10*3/uL (ref 0.0–0.1)
Basophils Relative: 0 %
EOS PCT: 1 %
Eosinophils Absolute: 0.1 10*3/uL (ref 0.0–0.5)
HEMATOCRIT: 44.7 % (ref 36.0–46.0)
HEMOGLOBIN: 15.4 g/dL — AB (ref 12.0–15.0)
IMMATURE GRANULOCYTES: 0 %
LYMPHS PCT: 24 %
Lymphs Abs: 1.5 10*3/uL (ref 0.7–4.0)
MCH: 31.8 pg (ref 26.0–34.0)
MCHC: 34.5 g/dL (ref 30.0–36.0)
MCV: 92.4 fL (ref 80.0–100.0)
Monocytes Absolute: 0.7 10*3/uL (ref 0.1–1.0)
Monocytes Relative: 11 %
NEUTROS ABS: 3.9 10*3/uL (ref 1.7–7.7)
NEUTROS PCT: 64 %
NRBC: 0 % (ref 0.0–0.2)
Platelets: 275 10*3/uL (ref 150–400)
RBC: 4.84 MIL/uL (ref 3.87–5.11)
RDW: 11.8 % (ref 11.5–15.5)
WBC: 6.1 10*3/uL (ref 4.0–10.5)

## 2018-07-11 LAB — I-STAT BETA HCG BLOOD, ED (MC, WL, AP ONLY): I-stat hCG, quantitative: <5 m[IU]/mL (ref <5)

## 2018-07-11 LAB — COMPREHENSIVE METABOLIC PANEL
ALBUMIN: 3.9 g/dL (ref 3.5–5.0)
ALK PHOS: 37 U/L — AB (ref 38–126)
ALT: 11 U/L (ref 0–44)
ANION GAP: 9 (ref 5–15)
AST: 17 U/L (ref 15–41)
BUN: <5 mg/dL — ABNORMAL LOW (ref 6–20)
CHLORIDE: 101 mmol/L (ref 98–111)
CO2: 26 mmol/L (ref 22–32)
Calcium: 8.7 mg/dL — ABNORMAL LOW (ref 8.9–10.3)
Creatinine, Ser: 0.71 mg/dL (ref 0.44–1.00)
GFR calc non Af Amer: >60 mL/min (ref >60)
GLUCOSE: 117 mg/dL — AB (ref 70–99)
Potassium: 4.2 mmol/L (ref 3.5–5.1)
SODIUM: 136 mmol/L (ref 135–145)
Total Bilirubin: 0.9 mg/dL (ref 0.3–1.2)
Total Protein: 7.1 g/dL (ref 6.5–8.1)

## 2018-07-11 LAB — URINALYSIS, ROUTINE W REFLEX MICROSCOPIC
BILIRUBIN URINE: NEGATIVE
Glucose, UA: NEGATIVE mg/dL
Hgb urine dipstick: NEGATIVE
Ketones, ur: 5 mg/dL — AB
LEUKOCYTES UA: NEGATIVE
NITRITE: NEGATIVE
Protein, ur: NEGATIVE mg/dL
SPECIFIC GRAVITY, URINE: 1.018 (ref 1.005–1.030)
pH: 7 (ref 5.0–8.0)

## 2018-07-11 LAB — I-STAT TROPONIN, ED: TROPONIN I, POC: 0.01 ng/mL (ref 0.00–0.08)

## 2018-07-11 LAB — D-DIMER, QUANTITATIVE (NOT AT ARMC): D DIMER QUANT: 0.62 ug{FEU}/mL — AB (ref 0.00–0.50)

## 2018-07-11 LAB — LIPASE, BLOOD: Lipase: 15 U/L (ref 11–51)

## 2018-07-11 MED ORDER — SODIUM CHLORIDE 0.9 % IV BOLUS
1000.0000 mL | Freq: Once | INTRAVENOUS | Status: AC
  Administered 2018-07-11: 1000 mL via INTRAVENOUS

## 2018-07-11 MED ORDER — METHOCARBAMOL 1000 MG/10ML IJ SOLN
1000.0000 mg | Freq: Once | INTRAVENOUS | Status: AC
  Administered 2018-07-11: 1000 mg via INTRAVENOUS
  Filled 2018-07-11: qty 10

## 2018-07-11 MED ORDER — KETOROLAC TROMETHAMINE 30 MG/ML IJ SOLN
30.0000 mg | Freq: Once | INTRAMUSCULAR | Status: AC
  Administered 2018-07-11: 30 mg via INTRAVENOUS
  Filled 2018-07-11: qty 1

## 2018-07-11 MED ORDER — METHOCARBAMOL 500 MG PO TABS: 500.0000 mg | ORAL_TABLET | Freq: Two times a day (BID) | ORAL | 0 refills | Status: DC

## 2018-07-11 MED ORDER — NAPROXEN 500 MG PO TABS: 500.0000 mg | ORAL_TABLET | Freq: Two times a day (BID) | ORAL | 0 refills | Status: DC

## 2018-07-11 NOTE — ED Provider Notes (Signed)
[facility] EMERGENCY DEPARTMENT Provider Note   CSN: [id number] Arrival date & time: 07/11/18  6962     History   Chief Complaint Chief Complaint  Patient presents with  . Shortness of Breath  . Spasms    HPI [name] is a 37 y.o. female.  [name] is a 37 y.o. female with a history of prolonged QT syndrome, polysubstance abuse, and recently diagnosed fibromyalgia, who presents to the emergency department for evaluation of shortness of breath and right-sided rib cage pain.  She reports it feels like her muscles are in a spasm and it hurts to take a deep breath.  Symptoms started last night but have worsened this morning.  She denies any associated cough or fever.  Reports her abdominal muscles feel tense as well but she has not had any nausea, vomiting, diarrhea or constipation.  Denies any urinary symptoms.  She has not taken anything to treat these pain prior to arrival.  Reports because of her QT syndrome she is afraid to take any over-the-counter medications without obstruction.  She denies any lower extremity swelling or pain.  She does report continued tobacco use but denies any history of PE or DVT, does have history of a previously elevated d-dimer but they did not find any evidence of blood clot.  No recent long distance travel or surgeries.     Past Medical History:  Diagnosis Date  . Long Q-T syndrome   . PMDD (premenstrual dysphoric disorder)   . Polysubstance abuse (HCC)    a. 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 ? Needs INH.  . Tobacco abuse     Patient Active Problem List   Diagnosis Date Noted  . Right ovarian cyst 04/19/2016  . Methadone maintenance therapy patient (HCC) 03/28/2016  . Abdominal pain, left lower quadrant 03/28/2016  . Tobacco abuse 03/02/2016  . Palpitations 08/21/2013  . Prolonged Q-T interval on  ECG 01/28/2013    History reviewed. No pertinent surgical history.   OB History   No obstetric history on file.      Home Medications    Prior to Admission medications   Medication Sig Start Date End Date Taking? Authorizing Provider  DULoxetine (CYMBALTA) 20 MG capsule Take 20 mg by mouth daily.   Yes [provider]  METHADONE HCL PO Take 20 mLs by mouth daily.    Yes [provider]  busPIRone (BUSPAR) 10 MG tablet Take 1 tablet (10 mg total) by mouth 3 (three) times daily. Patient not taking: Reported on 07/11/2018 05/02/17   Ofilia Neas, PA-C  fluticasone [facility]) 50 MCG/ACT nasal spray Place 2 sprays into both nostrils daily. Patient not taking: Reported on 07/11/2018 09/12/17   Belinda Fisher, PA-C  ipratropium (ATROVENT) 0.06 % nasal spray Place 2 sprays into both nostrils 4 (four) times daily. Patient not taking: Reported on 07/11/2018 09/12/17   Belinda Fisher, PA-C  meloxicam (MOBIC) 7.5 MG tablet Take 1 tablet (7.5 mg total) by mouth daily. Patient not taking: Reported on 07/11/2018 09/12/17   Belinda Fisher, PA-C  methocarbamol (ROBAXIN) 500 MG tablet Take 1 tablet (500 mg total) by mouth 2 (two) times daily. 07/11/18   Dartha Lodge, PA-C  naproxen (NAPROSYN) 500 MG tablet Take 1 tablet (500 mg total) by mouth 2 (two) times daily. 07/11/18   Dartha Lodge, PA-C  sertraline (  ZOLOFT) 50 MG tablet Take 1 tablet (50 mg total) by mouth daily. Start with 1/2 tab for first cycle, and then increase to 1 tab for next cycle if needed. Patient not taking: Reported on 07/11/2018 04/24/17   Armando ReichertHogan, Heather D, CNM    Family History Family History  Problem Relation Age of Onset  . CAD Other        Grandmother's father  . Other Other        Mother was worked up remotely for ?parasympathetic nervous system problem but cardiac workup was reportedly unremarkable    Social History Social History   Tobacco Use  . Smoking status: Current Every Day Smoker    Packs/day: 2.00    . Smokeless tobacco: Never Used  . Tobacco comment: Smoker since age 37  Substance Use Topics  . Alcohol use: No  . Drug use: No    Comment: Remote history of opiates, heroin - clean for 8 years      Allergies   Iodinated diagnostic agents; Clindamycin/lincomycin; Other; and Sulfa antibiotics   Review of Systems Review of Systems  Constitutional: Negative for chills and fever.  Eyes: Negative for visual disturbance.  Respiratory: Positive for chest tightness, shortness of breath and stridor. Negative for cough and wheezing.   Cardiovascular: Positive for chest pain. Negative for palpitations and leg swelling.  Gastrointestinal: Positive for abdominal pain. Negative for abdominal distention, nausea and vomiting.  Genitourinary: Negative for dysuria, flank pain and frequency.  Musculoskeletal: Positive for myalgias. Negative for arthralgias and back pain.  Skin: Negative for color change and rash.  Neurological: Negative for dizziness, syncope and light-headedness.     Physical Exam Updated Vital Signs BP 116/60   Pulse 78   Temp 97.8 F (36.6 C) (Oral)   Resp 17   Wt 47.6 kg   LMP 07/04/2018   SpO2 100%   BMI 21.21 kg/m   Physical Exam Vitals signs and nursing note reviewed.  Constitutional:      General: She is not in acute distress.    Appearance: She is well-developed and normal weight. She is not diaphoretic.     Comments: Patient appears uncomfortable but is in no acute distress.  HENT:     Head: Normocephalic and atraumatic.     Mouth/Throat:     Mouth: Mucous membranes are moist.     Pharynx: Oropharynx is clear.  Eyes:     General:        Right eye: No discharge.        Left eye: No discharge.  Cardiovascular:     Rate and Rhythm: Normal rate and regular rhythm.     Pulses: Normal pulses.     Heart sounds: Normal heart sounds. No murmur. No friction rub. No gallop.   Pulmonary:     Effort: Pulmonary effort is normal. No respiratory distress.      Breath sounds: Normal breath sounds.     Comments: Respirations equal and unlabored, patient able to speak in full sentences, lungs clear to auscultation bilaterally, tenderness to palpation over the right lateral ribs without overlying skin changes or palpable deformity. Chest:     Chest wall: Tenderness present.  Abdominal:     General: Abdomen is flat. Bowel sounds are normal. There is no distension.     Palpations: Abdomen is soft. There is no hepatomegaly or mass.     Tenderness: There is abdominal tenderness. There is no guarding.     Comments: Abdomen is soft and nondistended  with bowel sounds present throughout, patient endorses some mild generalized tenderness throughout the abdomen there is no focal area of pain or guarding, no rigidity or peritoneal signs.  Musculoskeletal:     Right lower leg: She exhibits no tenderness. No edema.     Left lower leg: She exhibits no tenderness. No edema.  Skin:    General: Skin is warm and dry.     Capillary Refill: Capillary refill takes less than 2 seconds.  Neurological:     Mental Status: She is alert and oriented to person, place, and time.     Coordination: Coordination normal.  Psychiatric:        Mood and Affect: Mood normal.        Behavior: Behavior normal.      ED Treatments / Results  Labs (all labs ordered are listed, but only abnormal results are displayed) Labs Reviewed  COMPREHENSIVE METABOLIC PANEL - Abnormal; Notable for the following components:      Result Value   Glucose, Bld 117 (*)    BUN <5 (*)    Calcium 8.7 (*)    Alkaline Phosphatase 37 (*)    All other components within normal limits  URINALYSIS, ROUTINE W REFLEX MICROSCOPIC - Abnormal; Notable for the following components:   Color, Urine AMBER (*)    APPearance HAZY (*)    Ketones, ur 5 (*)    All other components within normal limits  CBC WITH DIFFERENTIAL/PLATELET - Abnormal; Notable for the following components:   Hemoglobin 15.4 (*)    All other  components within normal limits  D-DIMER, QUANTITATIVE (NOT AT [facility]) - Abnormal; Notable for the following components:   D-Dimer, Quant 0.62 (*)    All other components within normal limits  LIPASE, BLOOD  I-STAT TROPONIN, ED  I-STAT BETA HCG BLOOD, ED ([facility], [facility], [facility] ONLY)    EKG EKG Interpretation  Date/Time:  Wednesday July 11 2018 09:37:25 EST Ventricular Rate:  71 PR Interval:    QRS Duration: 84 QT Interval:  405 QTC Calculation: 441 R Axis:   74 Text Interpretation:  Sinus rhythm LAE, consider biatrial enlargement Probable anteroseptal infarct, old Confirmed by Virgina Norfolk [phone]) on 07/11/2018 10:04:59 AM   Radiology Dg Chest 2 View  Result Date: 07/11/2018 CLINICAL DATA:  Right chest pain, shortness of breath, positive TB test EXAM: CHEST - 2 VIEW COMPARISON:  09/14/2014 FINDINGS: The heart size and mediastinal contours are within normal limits. Both lungs are clear. The visualized skeletal structures are unremarkable. IMPRESSION: No active cardiopulmonary disease. Electronically Signed   By: Judie Petit.  Shick M.D.   On: 07/11/2018 10:30    Procedures Procedures (including critical care time)  Medications Ordered in ED Medications  sodium chloride 0.9 % bolus 1,000 mL (0 mLs Intravenous Stopped 07/11/18 1240)  ketorolac (TORADOL) 30 MG/ML injection 30 mg (30 mg Intravenous Given 07/11/18 0954)  methocarbamol (ROBAXIN) 1,000 mg in dextrose 5 % 50 mL IVPB (0 mg Intravenous Stopped 07/11/18 1240)     Initial Impression / Assessment and Plan / ED Course  I have reviewed the triage vital signs and the nursing notes.  Pertinent labs & imaging results that were available during my care of the patient were reviewed by me and considered in my medical decision making (see chart for details).  Patient presents for evaluation of right-sided pleuritic pain over the lateral ribs that started last night and has been persistent and worsening.  She reports associated shortness of  breath.  She also reports  some generalized abdominal tenderness and reports her abdominal wall muscles feel tender as well.  She denies any fevers, cough, nausea, vomiting or diarrhea.  Does report that she was recently diagnosed with fibromyalgia and has history of "spasms in the past" wonders if this could be contributing, but reports they are not usually this severe.  Patient does not have history of PE and is not tachycardic, hypoxic or tachypneic, but story of pleuritic chest pain and shortness of breath is certainly concerning for PE.  This could also be muscle spasm and chest wall pain given tenderness on exam.  We will get basic labs, troponin, d-dimer, EKG, chest x-ray and give fluids, Toradol and Robaxin for symptomatic management.  EKG without concerning changes and troponin negative.  No leukocytosis, hemoglobin is slightly elevated likely in the setting of hemoconcentration, no acute electrolyte derangements requiring intervention, normal liver and renal function, normal lipase.  Urinalysis without signs of infection.  Chest x-ray shows no acute cardiopulmonary disease.  D-dimer is slightly elevated at 0.62 and given patient's symptoms I think it would be pertinent to proceed with imaging to rule out PE.  Patient has history of severe contrast allergy causing shortness of breath, chest heaviness and near syncope, this occurred during a CT PE study on 2016 and patient has not had any contrast since then, is not able to receive epinephrine due to cardiac issue.  Feel it may be more prudent to do a V/Q study, discussed with radiology and they agree on VQ rather than premedicated CT scan.  Discussed this with patient, she reports she is feeling much better after symptomatic treatment and would much rather go home.  Discussed with them that cannot rule out pulmonary embolus which can be life-threatening without further imaging, patient reports she is trying to get home as her great-grandmother is in town  for Christmas today.  Will obtain time estimate from nuclear medicine technician and discussed with patient.  11:45 PM discussed with patient that it would be 2 to 3 hours before they could begin her VQ scan.  Patient does not wish to stay for further evaluation.  I have discussed with her that given positive d-dimer and her symptoms I cannot rule out that she is having a PE, and that this could be life-threatening or cause significant disability.  She does report that she is feeling much better after symptomatic treatment here in the emergency department.  She has decision-making capacity and understands and accepts risks.  Wishes to leave AGAINST MEDICAL ADVICE.  She will return for any worsening symptoms.  Will discharge with NSAIDs and muscle relaxers for continued symptomatic treatment.  I have encouraged her to return to continue her work-up at any time.  Return precautions have been discussed.  Final Clinical Impressions(s) / ED Diagnoses   Final diagnoses:  Pleuritic chest pain  Shortness of breath  Muscle spasm    ED Discharge Orders         Ordered    naproxen (NAPROSYN) 500 MG tablet  2 times daily     07/11/18 1158    methocarbamol (ROBAXIN) 500 MG tablet  2 times daily     07/11/18 1158           Dartha LodgeFord, Lamari Youngers N, [place]PA-C 07/11/18 1621    Virgina Norfolkuratolo, Adam, DO 07/12/18 0710

## 2018-07-11 NOTE — Discharge Instructions (Signed)
You can return for continued evaluation of your symptoms at any time.  Your d-dimer was positive today and I cannot rule out a blood clot in your lung without further testing.  In the meantime you can take muscle relaxers, these can cause drowsiness do not take before driving, and naproxen twice daily to help with symptoms, ice and heat.  Return for worsening chest pain, shortness of breath or any other new or concerning symptoms.

## 2018-07-11 NOTE — ED Notes (Signed)
Patient transported to X-ray 

## 2018-07-11 NOTE — ED Triage Notes (Signed)
Pt in from home with c/o sob and R side rib cage spasm since last night. Pt states pain came suddenly, tender when pressed and worse when breathing in. Sats 100% on RA, has hx of fibromyalgia

## 2018-07-26 ENCOUNTER — Encounter (HOSPITAL_COMMUNITY): Payer: Self-pay | Admitting: Emergency Medicine

## 2018-07-26 ENCOUNTER — Ambulatory Visit (HOSPITAL_COMMUNITY)
Admission: EM | Admit: 2018-07-26 | Discharge: 2018-07-26 | Disposition: A | Discharge status: Home / Self Care | Payer: Self-pay | Attending: Family Medicine | Admitting: Family Medicine

## 2018-07-26 ENCOUNTER — Other Ambulatory Visit: Payer: Self-pay

## 2018-07-26 DIAGNOSIS — R059 Cough, unspecified: Secondary | ICD-10-CM

## 2018-07-26 DIAGNOSIS — R05 Cough: Secondary | ICD-10-CM

## 2018-07-26 DIAGNOSIS — R062 Wheezing: Secondary | ICD-10-CM | POA: Insufficient documentation

## 2018-07-26 MED ORDER — BENZONATATE 100 MG PO CAPS: ORAL_CAPSULE | ORAL | 0 refills | Status: DC

## 2018-07-26 MED ORDER — PREDNISONE 10 MG (21) PO TBPK: ORAL_TABLET | Freq: Every day | ORAL | 0 refills | Status: DC

## 2018-07-26 NOTE — ED Provider Notes (Signed)
[facility]   [id number] 07/26/18 Arrival Time: 1112  ASSESSMENT & PLAN:  1. Cough   2. Wheezing    Meds ordered this encounter  Medications  . benzonatate (TESSALON) 100 MG capsule    Sig: Take 1 capsule by mouth every 8 (eight) hours for cough.    Dispense:  21 capsule    Refill:  0  . predniSONE (STERAPRED UNI-PAK 21 TAB) 10 MG (21) TBPK tablet    Sig: Take by mouth daily. Take as directed.    Dispense:  21 tablet    Refill:  0   Discussed typical duration of symptoms. OTC symptom care as needed. Ensure adequate fluid intake and rest. May f/u with PCP or here as needed.  Reviewed expectations re: course of current medical issues. Questions answered. Outlined signs and symptoms indicating need for more acute intervention. Patient verbalized understanding. After Visit Summary given.   SUBJECTIVE: History from: patient.  [name] is a 38 y.o. female who presents with complaint of mild nasal congestion, post-nasal drainage, and a persistent dry cough; with sore throat but this resolved quickly  Onset abrupt, 3-4 days ago. Overall without fatigue and without body aches. SOB: none. Wheezing: moderate but sporadic; worse at night. Fever: no. Overall normal PO intake without n/v. Known sick contacts: no. No specific or significant aggravating or alleviating factors reported. OTC treatment: none reported.  Social History   Tobacco Use  Smoking Status Current Every Day Smoker  . Packs/day: 2.00  Smokeless Tobacco Never Used  Tobacco Comment   Smoker since age 56   ROS: As per HPI.   OBJECTIVE:  Vitals:   07/26/18 1140  BP: 104/60  Pulse: 84  Resp: 18  Temp: 98.4 F (36.9 C)  TempSrc: Oral  SpO2: 100%    General appearance: alert; no distress HEENT: nasal congestion; clear runny nose; throat irritation secondary to post-nasal drainage Neck: supple without LAD CV: RRR Lungs: unlabored respirations, symmetrical air entry with bilateral  expiratory wheezing; cough: moderate; able to speak full sentence without difficulty Abd: soft Ext: no LE edema Skin: warm and dry Psychological: alert and cooperative; normal mood and affect   Allergies  Allergen Reactions  . Iodinated Diagnostic Agents Shortness Of Breath and Palpitations    CTA chest PE done 08/2014. Pt had SOB, chest heaviness, near syncope after contrast was administered. (Pt is unable to have Epinephrine due to cardiac issue)  . Clindamycin/Lincomycin     palpitations  . Other Other (See Comments)    QT Prolonging Drugs  . Sulfa Antibiotics Nausea Only    Past Medical History:  Diagnosis Date  . Long Q-T syndrome   . PMDD (premenstrual dysphoric disorder)   . Polysubstance abuse (HCC)    a. 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 ? Needs INH.  . Tobacco abuse    Family History  Problem Relation Age of Onset  . CAD Other        Grandmother's father  . Other Other        Mother was worked up remotely for ?parasympathetic nervous system problem but cardiac workup was reportedly unremarkable   Social History   Socioeconomic History  . Marital status: Single    Spouse name: Not on file  . Number of children: Not on file  . Years of education: Not on file  . Highest education level: Not on  file  Occupational History  . Not on file  Social Needs  . Financial resource strain: Not on file  . Food insecurity:    Worry: Not on file    Inability: Not on file  . Transportation needs:    Medical: Not on file    Non-medical: Not on file  Tobacco Use  . Smoking status: Current Every Day Smoker    Packs/day: 2.00  . Smokeless tobacco: Never Used  . Tobacco comment: Smoker since age 4  Substance and Sexual Activity  . Alcohol use: No  . Drug use: No    Comment: Remote history of opiates, heroin - clean for 8 years   . Sexual activity: Yes     Birth control/protection: None  Lifestyle  . Physical activity:    Days per week: Not on file    Minutes per session: Not on file  . Stress: Not on file  Relationships  . Social connections:    Talks on phone: Not on file    Gets together: Not on file    Attends religious service: Not on file    Active member of club or organization: Not on file    Attends meetings of clubs or organizations: Not on file    Relationship status: Not on file  . Intimate partner violence:    Fear of current or ex partner: Not on file    Emotionally abused: Not on file    Physically abused: Not on file    Forced sexual activity: Not on file  Other Topics Concern  . Not on file  Social History Narrative  . Not on file           Mardella Layman, MD 07/26/18 1205

## 2018-07-26 NOTE — ED Triage Notes (Signed)
PT reports productive cough, drainage, congestion, chest discomfort with cough for 5 days

## 2018-12-25 ENCOUNTER — Ambulatory Visit (HOSPITAL_COMMUNITY)
Admission: EM | Admit: 2018-12-25 | Discharge: 2018-12-25 | Disposition: A | Discharge status: Home / Self Care | Payer: Self-pay | Attending: Family Medicine | Admitting: Family Medicine

## 2018-12-25 ENCOUNTER — Encounter (HOSPITAL_COMMUNITY): Payer: Self-pay

## 2018-12-25 ENCOUNTER — Other Ambulatory Visit: Payer: Self-pay

## 2018-12-25 DIAGNOSIS — N760 Acute vaginitis: Secondary | ICD-10-CM | POA: Insufficient documentation

## 2018-12-25 LAB — POCT URINALYSIS DIP (DEVICE)
Bilirubin Urine: NEGATIVE
Glucose, UA: NEGATIVE mg/dL
Ketones, ur: NEGATIVE mg/dL
Leukocytes,Ua: NEGATIVE
Nitrite: NEGATIVE
Protein, ur: NEGATIVE mg/dL
Specific Gravity, Urine: 1.015 (ref 1.005–1.030)
Urobilinogen, UA: 0.2 mg/dL (ref 0.0–1.0)
pH: 7 (ref 5.0–8.0)

## 2018-12-25 LAB — POCT PREGNANCY, URINE: Preg Test, Ur: NEGATIVE

## 2018-12-25 MED ORDER — CLOTRIMAZOLE 1 % VA CREA: 1.0000 | TOPICAL_CREAM | Freq: Every day | VAGINAL | 0 refills | Status: AC

## 2018-12-25 NOTE — ED Provider Notes (Signed)
[place]    CSN: [id number] Arrival date & time: 12/25/18  1100     History   Chief Complaint Chief Complaint  Patient presents with  . Exposure to STD    HPI [name] is a 38 y.o. female history of prolonged QT, polysubstance abuse, presenting today for evaluation of vaginal itching and concern for possible exposure to herpes.  Patient states that on Friday she had intercourse with someone who recently informed her that they have a history of herpes.  He has not been on any antivirals, but had not had any outbreaks.  She is concerned about her chances of contracting herpes.  States that they initially were using a condom, but he did penetrate without protection as well as partake in oral intercourse.  On Sunday she started to develop some itching and irritation.  She is tried using over-the-counter Monistat as she thought her symptoms were related to yeast.  She also endorses that she was on her last menstrual cycle last week.  She did do some douching related to this.  She denies any form of birth control.  Denies any rashes or lesions.  Denies any vaginal discharge which she typically has when she has had yeast infections in the past.  Previously has had BV, but initially thought symptoms were related to yeast.  HPI  Past Medical History:  Diagnosis Date  . Long Q-T syndrome   . PMDD (premenstrual dysphoric disorder)   . Polysubstance abuse ([place])    a. 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 ? Needs INH.  . Tobacco abuse     Patient Active Problem List   Diagnosis Date Noted  . Right ovarian cyst 04/19/2016  . Methadone maintenance therapy patient ([place]) 03/28/2016  . Abdominal pain, left lower quadrant 03/28/2016  . Tobacco abuse 03/02/2016  . Palpitations 08/21/2013  . Prolonged Q-T interval on ECG 01/28/2013    History reviewed. No pertinent  surgical history.  OB History   No obstetric history on file.      Home Medications    Prior to Admission medications   Medication Sig Start Date End Date Taking? Authorizing Provider  clotrimazole (GYNE-LOTRIMIN) 1 % vaginal cream Place 1 Applicatorful vaginally at bedtime for 7 days. 12/25/18 01/01/19  Sylvia Helms C, PA-C  DULoxetine (CYMBALTA) 20 MG capsule Take 20 mg by mouth daily.    [provider]  METHADONE HCL PO Take 20 mLs by mouth daily.     [provider]  methocarbamol (ROBAXIN) 500 MG tablet Take 1 tablet (500 mg total) by mouth 2 (two) times daily. 07/11/18   Jacqlyn Larsen, PA-C  naproxen (NAPROSYN) 500 MG tablet Take 1 tablet (500 mg total) by mouth 2 (two) times daily. 07/11/18   Jacqlyn Larsen, PA-C    Family History Family History  Problem Relation Age of Onset  . CAD Other        Grandmother's father  . Other Other        Mother was worked up remotely for ?parasympathetic nervous system problem but cardiac workup was reportedly unremarkable    Social History Social History   Tobacco Use  . Smoking status: Current Every Day Smoker    Packs/day: 2.00  . Smokeless tobacco: Never Used  . Tobacco comment: Smoker since age 26  Substance Use Topics  . Alcohol use: No  .  Drug use: No    Comment: Remote history of opiates, heroin - clean for 8 years      Allergies   Iodinated diagnostic agents; Clindamycin/lincomycin; Other; and Sulfa antibiotics   Review of Systems Review of Systems  Constitutional: Negative for fever.  Respiratory: Negative for shortness of breath.   Cardiovascular: Negative for chest pain.  Gastrointestinal: Negative for abdominal pain, diarrhea, nausea and vomiting.  Genitourinary: Negative for dysuria, flank pain, genital sores, hematuria, menstrual problem, vaginal bleeding, vaginal discharge and vaginal pain.  Musculoskeletal: Negative for back pain.  Skin: Negative for rash.  Neurological: Negative for  dizziness, light-headedness and headaches.     Physical Exam Triage Vital Signs ED Triage Vitals  Enc Vitals Group     BP 12/25/18 1123 106/68     Pulse Rate 12/25/18 1123 80     Resp 12/25/18 1123 18     Temp 12/25/18 1123 98.7 F (37.1 C)     Temp Source 12/25/18 1123 Oral     SpO2 12/25/18 1123 93 %     Weight --      Height --      Head Circumference --      Peak Flow --      Pain Score 12/25/18 1124 0     Pain Loc --      Pain Edu? --      Excl. in GC? --    No data found.  Updated Vital Signs BP 106/68 (BP Location: Left Arm)   Pulse 80   Temp 98.7 F (37.1 C) (Oral)   Resp 18   LMP 12/18/2018   SpO2 93%   Visual Acuity Right Eye Distance:   Left Eye Distance:   Bilateral Distance:    Right Eye Near:   Left Eye Near:    Bilateral Near:     Physical Exam Vitals signs and nursing note reviewed.  Constitutional:      General: She is not in acute distress.    Appearance: She is well-developed.  HENT:     Head: Normocephalic and atraumatic.  Eyes:     Conjunctiva/sclera: Conjunctivae normal.  Neck:     Musculoskeletal: Neck supple.  Cardiovascular:     Rate and Rhythm: Normal rate and regular rhythm.     Heart sounds: No murmur.  Pulmonary:     Effort: Pulmonary effort is normal. No respiratory distress.     Breath sounds: Normal breath sounds.  Abdominal:     Palpations: Abdomen is soft.     Tenderness: There is no abdominal tenderness.  Genitourinary:    Comments: Normal external female genitalia, no rashes or lesions observed externally, introitus/vulva does appear slightly erythematous, there is a thicker white discharge present in vagina, likely from Monistat Skin:    General: Skin is warm and dry.  Neurological:     Mental Status: She is alert.      UC Treatments / Results  Labs (all labs ordered are listed, but only abnormal results are displayed) Labs Reviewed  POCT URINALYSIS DIP (DEVICE) - Abnormal; Notable for the following  components:      Result Value   Hgb urine dipstick TRACE (*)    All other components within normal limits  HSV(HERPES SIMPLEX VRS) I + II AB-IGM  POC URINE PREG, ED  POCT PREGNANCY, URINE  CERVICOVAGINAL ANCILLARY ONLY    EKG None  Radiology No results found.  Procedures Procedures (including critical care time)  Medications Ordered in UC Medications - No  data to display  Initial Impression / Assessment and Plan / UC Course  I have reviewed the triage vital signs and the nursing notes.  Pertinent labs & imaging results that were available during my care of the patient were reviewed by me and considered in my medical decision making (see chart for details).     Current itching and irritation likely yeast versus BV.  Will empirically treat for yeast today, swab obtained to check for BV, yeast as well as STDs given her recent unprotected intercourse.  Discussed at length regarding herpes testing and accuracy/benefit of blood work.  Discussed swab of direct lesions is the most accurate/informative.  Patient expressed significant concern and anxiety about the herpes testing and wished to proceed knowing that the blood work does not fully provide answers.  She requested postexposure prophylaxis with antivirals, discussed that this is not recommended and will not change whether or not she has been exposed.  She is concerned about transmitting this to her boyfriend.  Has history of prolonged QT- avoiding QT prolonging drugs.  Patient are to be positive for chlamydia would recommend treatment with doxycycline of azithromycin.  Would recommend vaginal metronidazole cream over oral for BV/trichomonas.   Final Clinical Impressions(s) / UC Diagnoses   Final diagnoses:  Vaginitis and vulvovaginitis     Discharge Instructions     Please begin using clotrimazole vaginal cream at that time to treat for yeast We are checking the swab for gonorrhea, chlamydia, trichomonas as well as yeast  and BV, we will call you with these results and provide further treatment if needed We are also checking your blood for HSV, but again this is not a definitive answer.  If you ever have an outbreak, rash or lesion in the genital region please follow-up so that we may swab this. It is not recommended to have post exposure treatment with antivirals Please use condoms and protective measures to prevent further exposure to STDs  Please follow-up if symptoms changing, worsening or not getting better   ED Prescriptions    Medication Sig Dispense Auth. Provider   clotrimazole (GYNE-LOTRIMIN) 1 % vaginal cream Place 1 Applicatorful vaginally at bedtime for 7 days. 45 g Tylerjames Hoglund, [place] C, PA-C     Controlled Substance Prescriptions [place] Controlled Substance Registry consulted? Not Applicable   Lew DawesWieters, Calli Bashor C, [place] Jersey[place]-C 12/25/18 1313

## 2018-12-25 NOTE — ED Triage Notes (Signed)
Pt presents with vaginal itching; patient states she was just informed by partner that has herpes and has not been on anti viral and had not had an outbreak in [place] so he did not disclose his status to her until recently.

## 2018-12-25 NOTE — Discharge Instructions (Signed)
Please begin using clotrimazole vaginal cream at that time to treat for yeast We are checking the swab for gonorrhea, chlamydia, trichomonas as well as yeast and BV, we will call you with these results and provide further treatment if needed We are also checking your blood for HSV, but again this is not a definitive answer.  If you ever have an outbreak, rash or lesion in the genital region please follow-up so that we may swab this. It is not recommended to have post exposure treatment with antivirals Please use condoms and protective measures to prevent further exposure to STDs  Please follow-up if symptoms changing, worsening or not getting better

## 2018-12-26 LAB — CERVICOVAGINAL ANCILLARY ONLY
Bacterial vaginitis: NEGATIVE
Candida vaginitis: POSITIVE — AB
Chlamydia: NEGATIVE
Neisseria Gonorrhea: NEGATIVE
Trichomonas: NEGATIVE

## 2018-12-26 LAB — HSV(HERPES SIMPLEX VRS) I + II AB-IGM: HSVI/II Comb IgM: <0.91 Ratio (ref 0.00–0.90)

## 2019-02-02 ENCOUNTER — Other Ambulatory Visit: Payer: Self-pay

## 2019-02-02 ENCOUNTER — Ambulatory Visit (INDEPENDENT_AMBULATORY_CARE_PROVIDER_SITE_OTHER): Payer: Self-pay

## 2019-02-02 ENCOUNTER — Ambulatory Visit (HOSPITAL_COMMUNITY)
Admission: EM | Admit: 2019-02-02 | Discharge: 2019-02-02 | Disposition: A | Discharge status: Home / Self Care | Payer: Self-pay | Attending: Family Medicine | Admitting: Family Medicine

## 2019-02-02 DIAGNOSIS — S60221A Contusion of right hand, initial encounter: Secondary | ICD-10-CM

## 2019-02-02 MED ORDER — DICLOFENAC SODIUM 75 MG PO TBEC: 75.0000 mg | DELAYED_RELEASE_TABLET | Freq: Two times a day (BID) | ORAL | 0 refills | Status: DC

## 2019-02-02 NOTE — Discharge Instructions (Signed)
The x-ray of your hand today shows no broken bones.
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
## 2019-03-14 ENCOUNTER — Other Ambulatory Visit: Payer: Self-pay

## 2019-03-14 ENCOUNTER — Ambulatory Visit (HOSPITAL_COMMUNITY)
Admission: EM | Admit: 2019-03-14 | Discharge: 2019-03-14 | Disposition: A | Discharge status: Home / Self Care | Payer: Self-pay | Attending: Urgent Care | Admitting: Urgent Care

## 2019-03-14 ENCOUNTER — Encounter (HOSPITAL_COMMUNITY): Payer: Self-pay | Admitting: Emergency Medicine

## 2019-03-14 DIAGNOSIS — B002 Herpesviral gingivostomatitis and pharyngotonsillitis: Secondary | ICD-10-CM

## 2019-03-14 DIAGNOSIS — B009 Herpesviral infection, unspecified: Secondary | ICD-10-CM

## 2019-03-14 MED ORDER — VALACYCLOVIR HCL 1 G PO TABS: 1000.0000 mg | ORAL_TABLET | Freq: Three times a day (TID) | ORAL | 0 refills | Status: AC

## 2019-03-14 NOTE — ED Provider Notes (Signed)
  MRN: [id number] DOB: 1981-02-24  Subjective:   [name] is a 38 y.o. female presenting for 2 day history of acute onset recurrent cold sore of right upper lip.  This is the worst episode ever.  She has Abreva in the past but is not helping at all at this particular episode.  She has never previously tried an antiviral medication like acyclovir or valacyclovir.  No current facility-administered medications for this encounter.   Current Outpatient Medications:  .  diclofenac (VOLTAREN) 75 MG EC tablet, Take 1 tablet (75 mg total) by mouth 2 (two) times daily., Disp: 14 tablet, Rfl: 0 .  DULoxetine (CYMBALTA) 20 MG capsule, Take 20 mg by mouth daily., Disp: , Rfl:  .  METHADONE HCL PO, Take 20 mLs by mouth daily. , Disp: , Rfl:  .  methocarbamol (ROBAXIN) 500 MG tablet, Take 1 tablet (500 mg total) by mouth 2 (two) times daily., Disp: 20 tablet, Rfl: 0   Allergies  Allergen Reactions  . Iodinated Diagnostic Agents Shortness Of Breath and Palpitations    CTA chest PE done 08/2014. Pt had SOB, chest heaviness, near syncope after contrast was administered. (Pt is unable to have Epinephrine due to cardiac issue)  . Clindamycin/Lincomycin     palpitations  . Other Other (See Comments)    QT Prolonging Drugs  . Sulfa Antibiotics Nausea Only    Past Medical History:  Diagnosis Date  . Long Q-T syndrome   . PMDD (premenstrual dysphoric disorder)   . Polysubstance abuse ([place])    a. 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 ? Needs INH.  . Tobacco abuse      History reviewed. No pertinent surgical history.  ROS  Objective:   Vitals: BP 118/76 (BP Location: Right Arm)   Pulse 77   Temp 98.7 F (37.1 C) (Oral)   Resp 18   SpO2 100%   Physical Exam Constitutional:      General: She is not in acute distress.    Appearance: Normal appearance. She is well-developed. She is  not ill-appearing.  HENT:     Head: Normocephalic and atraumatic.      Nose: Nose normal.     Mouth/Throat:     Mouth: Mucous membranes are moist.     Pharynx: Oropharynx is clear.  Eyes:     General: No scleral icterus.    Extraocular Movements: Extraocular movements intact.     Pupils: Pupils are equal, round, and reactive to light.  Cardiovascular:     Rate and Rhythm: Normal rate.  Pulmonary:     Effort: Pulmonary effort is normal.  Skin:    General: Skin is warm and dry.  Neurological:     General: No focal deficit present.     Mental Status: She is alert and oriented to person, place, and time.  Psychiatric:        Mood and Affect: Mood normal.        Behavior: Behavior normal.     Assessment and Plan :   1. HSV (herpes simplex virus) infection   2. Oral herpes     We will start Valtrex to help with this episode of HSV. Counseled patient on potential for adverse effects with medications prescribed/recommended today, ER and return-to-clinic precautions discussed, patient verbalized understanding.    Jaynee Eagles, [place] 03/14/19 1531

## 2019-03-14 NOTE — ED Triage Notes (Signed)
Pt sts cold sore to mouth worse than usual; pt sts hx of similar in past

## 2019-03-20 ENCOUNTER — Other Ambulatory Visit: Payer: Self-pay

## 2019-03-20 ENCOUNTER — Encounter (HOSPITAL_COMMUNITY): Payer: Self-pay

## 2019-03-20 ENCOUNTER — Ambulatory Visit (HOSPITAL_COMMUNITY)
Admission: EM | Admit: 2019-03-20 | Discharge: 2019-03-20 | Disposition: A | Discharge status: Home / Self Care | Payer: Self-pay | Attending: Family Medicine | Admitting: Family Medicine

## 2019-03-20 DIAGNOSIS — S6991XA Unspecified injury of right wrist, hand and finger(s), initial encounter: Secondary | ICD-10-CM | POA: Insufficient documentation

## 2019-03-20 DIAGNOSIS — N3090 Cystitis, unspecified without hematuria: Secondary | ICD-10-CM | POA: Insufficient documentation

## 2019-03-20 LAB — POCT URINALYSIS DIP (DEVICE)
Glucose, UA: 250 mg/dL — AB
Hgb urine dipstick: NEGATIVE
Ketones, ur: 15 mg/dL — AB
Nitrite: POSITIVE — AB
Protein, ur: >=300 mg/dL — AB
Specific Gravity, Urine: 1.015 (ref 1.005–1.030)
Urobilinogen, UA: >=8 mg/dL (ref 0.0–1.0)
pH: 6.5 (ref 5.0–8.0)

## 2019-03-20 MED ORDER — CEPHALEXIN 500 MG PO CAPS: 500.0000 mg | ORAL_CAPSULE | Freq: Two times a day (BID) | ORAL | 0 refills | Status: DC

## 2019-03-20 NOTE — ED Triage Notes (Signed)
Pt states has vaginal discomfort. Pt states she has been voiding small amounts of urine frequently this has been going on 2 days. Pt states she shut her hand in a truck door 2 days ago. Pt has a wound index finger right hand.

## 2019-03-21 NOTE — ED Provider Notes (Signed)
[facility]    ASSESSMENT & PLAN:  1. Cystitis   2. Injury of finger of right hand, initial encounter      Meds ordered this encounter  Medications  . cephALEXin (KEFLEX) 500 MG capsule    Sig: Take 1 capsule (500 mg total) by mouth 2 (two) times daily.    Dispense:  10 capsule    Refill:  0   No signs of pyelonephritis. Discussed. Urine culture sent. Will notify patient when results available. Will follow up with her PCP or here if not showing improvement over the next 48 hours, sooner if needed.  Declines imaging of finger. Low suspicion of fracture. Moving normally. Minimal pain. Observe and f/u if needed.  Outlined signs and symptoms indicating need for more acute intervention. Patient verbalized understanding. After Visit Summary given.  SUBJECTIVE:  [name] is a 38 y.o. female who complains of urinary frequency, urgency and dysuria for the past 2 days. Without associated flank pain, fever, chills, vaginal discharge or bleeding. Gross hematuria: not present. No specific aggravating or alleviating factors reported. No LE edema. Normal PO intake. Without specific abdominal pain. Ambulatory without difficulty. OTC treatment: none.  Also reports shutting end of her right 2nd finger in car door 2 d ago. "Still a little sore but moving it ok. Just wanted you to look at it." No extremity sensation changes or weakness. Overall improving.  LMP: Patient's last menstrual period was 03/17/2019.  ROS: As in HPI. All other systems negative.   OBJECTIVE:  Vitals:   03/20/19 1522 03/20/19 1523  BP: (!) 119/93   Pulse: 77   Resp: 16   Temp: 98.7 F (37.1 C)   TempSrc: Oral   SpO2: 99%   Weight:  49 kg   General appearance: alert; no distress HENT: oropharynx: moist Lungs: unlabored respirations Abdomen: soft, non-tender; bowel sounds normal; no masses or organomegaly; no guarding or rebound tenderness Back: no CVA tenderness Extremities: no edema; R  2nd finger with healing superficial cut; no tenderness and with FROM; normal capillary refill Skin: warm and dry Neurologic: normal gait Psychological: alert and cooperative; normal mood and affect  Labs Reviewed  POCT URINALYSIS DIP (DEVICE) - Abnormal; Notable for the following components:      Result Value   Glucose, UA 250 (*)    Bilirubin Urine MODERATE (*)    Ketones, ur 15 (*)    Protein, ur >=300 (*)    Nitrite POSITIVE (*)    Leukocytes,Ua LARGE (*)    All other components within normal limits  URINE CULTURE    Allergies  Allergen Reactions  . Iodinated Diagnostic Agents Shortness Of Breath and Palpitations    CTA chest PE done 08/2014. Pt had SOB, chest heaviness, near syncope after contrast was administered. (Pt is unable to have Epinephrine due to cardiac issue)  . Clindamycin/Lincomycin     palpitations  . Other Other (See Comments)    QT Prolonging Drugs  . Sulfa Antibiotics Nausea Only    Past Medical History:  Diagnosis Date  . Long Q-T syndrome   . PMDD (premenstrual dysphoric disorder)   . Polysubstance abuse ([facility])    a. 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  Marland Kitchen. 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 ? Needs INH.  . Tobacco abuse    Social History   Socioeconomic History  . Marital status: Single    Spouse name: Not on file  .  Number of children: Not on file  . Years of education: Not on file  . Highest education level: Not on file  Occupational History  . Not on file  Social Needs  . Financial resource strain: Not on file  . Food insecurity    Worry: Not on file    Inability: Not on file  . Transportation needs    Medical: Not on file    Non-medical: Not on file  Tobacco Use  . Smoking status: Current Every Day Smoker    Packs/day: 2.00  . Smokeless tobacco: Never Used  . Tobacco comment: Smoker since age 69  Substance and Sexual Activity  . Alcohol use: No  . Drug use:  No    Comment: Remote history of opiates, heroin - clean for 8 years   . Sexual activity: Yes    Birth control/protection: None  Lifestyle  . Physical activity    Days per week: Not on file    Minutes per session: Not on file  . Stress: Not on file  Relationships  . Social Herbalist on phone: Not on file    Gets together: Not on file    Attends religious service: Not on file    Active member of club or organization: Not on file    Attends meetings of clubs or organizations: Not on file    Relationship status: Not on file  . Intimate partner violence    Fear of current or ex partner: Not on file    Emotionally abused: Not on file    Physically abused: Not on file    Forced sexual activity: Not on file  Other Topics Concern  . Not on file  Social History Narrative  . Not on file   Family History  Problem Relation Age of Onset  . CAD Other        Grandmother's father  . Other Other        Mother was worked up remotely for ?parasympathetic nervous system problem but cardiac workup was reportedly unremarkable       Vanessa Kick, MD 03/21/19 [phone]

## 2019-03-22 ENCOUNTER — Encounter (HOSPITAL_COMMUNITY): Payer: Self-pay | Admitting: Emergency Medicine

## 2019-03-22 ENCOUNTER — Ambulatory Visit (HOSPITAL_COMMUNITY)
Admission: EM | Admit: 2019-03-22 | Discharge: 2019-03-22 | Disposition: A | Discharge status: Home / Self Care | Payer: Self-pay | Attending: Family Medicine | Admitting: Family Medicine

## 2019-03-22 ENCOUNTER — Other Ambulatory Visit: Payer: Self-pay

## 2019-03-22 DIAGNOSIS — R1011 Right upper quadrant pain: Secondary | ICD-10-CM | POA: Insufficient documentation

## 2019-03-22 DIAGNOSIS — R1030 Lower abdominal pain, unspecified: Secondary | ICD-10-CM | POA: Insufficient documentation

## 2019-03-22 DIAGNOSIS — Z3202 Encounter for pregnancy test, result negative: Secondary | ICD-10-CM

## 2019-03-22 LAB — POCT PREGNANCY, URINE: Preg Test, Ur: NEGATIVE

## 2019-03-22 LAB — COMPREHENSIVE METABOLIC PANEL
ALT: 17 U/L (ref 0–44)
AST: 23 U/L (ref 15–41)
Albumin: 3.9 g/dL (ref 3.5–5.0)
Alkaline Phosphatase: 59 U/L (ref 38–126)
Anion gap: 9 (ref 5–15)
BUN: 6 mg/dL (ref 6–20)
CO2: 26 mmol/L (ref 22–32)
Calcium: 8.6 mg/dL — ABNORMAL LOW (ref 8.9–10.3)
Chloride: 101 mmol/L (ref 98–111)
Creatinine, Ser: 0.65 mg/dL (ref 0.44–1.00)
GFR calc Af Amer: >60 mL/min (ref >60)
GFR calc non Af Amer: >60 mL/min (ref >60)
Glucose, Bld: 80 mg/dL (ref 70–99)
Potassium: 3.9 mmol/L (ref 3.5–5.1)
Sodium: 136 mmol/L (ref 135–145)
Total Bilirubin: 0.8 mg/dL (ref 0.3–1.2)
Total Protein: 7.3 g/dL (ref 6.5–8.1)

## 2019-03-22 LAB — POCT URINALYSIS DIP (DEVICE)
Glucose, UA: NEGATIVE mg/dL
Ketones, ur: NEGATIVE mg/dL
Leukocytes,Ua: NEGATIVE
Nitrite: POSITIVE — AB
Protein, ur: NEGATIVE mg/dL
Specific Gravity, Urine: >=1.03 (ref 1.005–1.030)
Urobilinogen, UA: 1 mg/dL (ref 0.0–1.0)
pH: 6 (ref 5.0–8.0)

## 2019-03-22 LAB — CBC WITH DIFFERENTIAL/PLATELET
Abs Immature Granulocytes: 0.02 10*3/uL (ref 0.00–0.07)
Basophils Absolute: 0 10*3/uL (ref 0.0–0.1)
Basophils Relative: 1 %
Eosinophils Absolute: 0.1 10*3/uL (ref 0.0–0.5)
Eosinophils Relative: 1 %
HCT: 43.8 % (ref 36.0–46.0)
Hemoglobin: 14.8 g/dL (ref 12.0–15.0)
Immature Granulocytes: 0 %
Lymphocytes Relative: 21 %
Lymphs Abs: 1.8 10*3/uL (ref 0.7–4.0)
MCH: 31.8 pg (ref 26.0–34.0)
MCHC: 33.8 g/dL (ref 30.0–36.0)
MCV: 94.2 fL (ref 80.0–100.0)
Monocytes Absolute: 0.8 10*3/uL (ref 0.1–1.0)
Monocytes Relative: 9 %
Neutro Abs: 5.9 10*3/uL (ref 1.7–7.7)
Neutrophils Relative %: 68 %
Platelets: 296 10*3/uL (ref 150–400)
RBC: 4.65 MIL/uL (ref 3.87–5.11)
RDW: 14 % (ref 11.5–15.5)
WBC: 8.6 10*3/uL (ref 4.0–10.5)
nRBC: 0 % (ref 0.0–0.2)

## 2019-03-22 LAB — URINE CULTURE: Culture: >=100000 — AB

## 2019-03-22 LAB — LIPASE, BLOOD: Lipase: 19 U/L (ref 11–51)

## 2019-03-22 MED ORDER — IBUPROFEN 800 MG PO TABS
ORAL_TABLET | ORAL | Status: AC
  Filled 2019-03-22: qty 1

## 2019-03-22 MED ORDER — IBUPROFEN 800 MG PO TABS: 800.0000 mg | ORAL_TABLET | Freq: Three times a day (TID) | ORAL | 0 refills | Status: DC

## 2019-03-22 MED ORDER — IBUPROFEN 800 MG PO TABS
800.0000 mg | ORAL_TABLET | Freq: Once | ORAL | Status: AC
  Administered 2019-03-22: 18:00:00 800 mg via ORAL

## 2019-03-22 NOTE — Discharge Instructions (Signed)
Plan b can cause some irregular bleeding/ period.  Negative pregnancy here today, therefore unlikely to be any pregnancy complication.  Ibuprofen, heat packs to help with pain.  Drink plenty of water.  We will collect some labs to ensure no obvious other sources of pain related to liver or gallbladder and to check your blood levels after having this bleeding.  We are also running your vaginal sample to ensure no infection present as this can contribute to vaginal bleeding.  Bland diet as tolerated.  Complete antibiotics for your UTI as prescribed.  Any worsening of symptoms please go to the ER.  Please follow up with gynecology for any lingering or persistent symptoms.

## 2019-03-22 NOTE — ED Provider Notes (Signed)
[facility]    CSN: [id number] Arrival date & time: 03/22/19  1554      History   Chief Complaint Chief Complaint  Patient presents with  . Abdominal Pain    HPI [name] is a 38 y.o. female.   [name] presents with complaints of "abominal swelling" as well as pelvic pain. Abdomen has felt tender and "swollen" since this morning. No nausea or vomiting. Has had some lightheadedness. She had 1 week of vaginal bleeding which stopped yesterday. It was the typical time frame for her period, but typically her periods do not last this long. She had taken plan b the week prior to that (2 weeks ago) due to having unprotected intercourse. She has not had bleeding today. She is on treatment for UTI, states those symptoms have improved. Has had loose stools for the past week, which have irritated her hemorrhoids. Last Bm was this morning. No blood in stool but occasionally her hemorrhoids have some bleeding. Had a fever at onset of UTI but no further fevers. Denies  Any other vaginal symptoms. States is is concerned she had a miscarriage. No previous history of miscarriage. Has had two abortions years ago, however. History  Of PMDD, substance abuse in remission, right ovarian cyst (states it is "complex" and causes intermittent pain), and abdominal pain.    ROS per HPI, negative if not otherwise mentioned.      Past Medical History:  Diagnosis Date  . Long Q-T syndrome   . PMDD (premenstrual dysphoric disorder)   . Polysubstance abuse ([place])    a. 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 ? Needs INH.  . Tobacco abuse     Patient Active Problem List   Diagnosis Date Noted  . Right ovarian cyst 04/19/2016  . 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 Abdominal pain, left lower quadrant 03/28/2016  . Tobacco abuse 03/02/2016  . Palpitations  08/21/2013  . Prolonged Q-T interval on ECG 01/28/2013    History reviewed. No pertinent surgical history.  OB History   No obstetric history on file.      Home Medications    Prior to Admission medications   Medication Sig Start Date End Date Taking? Authorizing Provider  cephALEXin (KEFLEX) 500 MG capsule Take 1 capsule (500 mg total) by mouth 2 (two) times daily. 03/20/19   Vanessa Kick, MD  diclofenac (VOLTAREN) 75 MG EC tablet Take 1 tablet (75 mg total) by mouth 2 (two) times daily. 02/02/19   Vanessa Kick, MD  DULoxetine (CYMBALTA) 20 MG capsule Take 20 mg by mouth daily.    [provider]  ibuprofen (ADVIL) 800 MG tablet Take 1 tablet (800 mg total) by mouth 3 (three) times daily. 03/22/19   Zigmund Gottron, NP  METHADONE HCL PO Take 20 mLs by mouth daily.     [provider]  methocarbamol (ROBAXIN) 500 MG tablet Take 1 tablet (500 mg total) by mouth 2 (two) times daily. 07/11/18   Jacqlyn Larsen, PA-C  valACYclovir (VALTREX) 1000 MG tablet Take 1 tablet (1,000 mg total) by mouth 3 (three) times daily for 10 days. 03/14/19 03/24/19  Jaynee Eagles, PA-C    Family History Family History  Problem Relation Age of Onset  . CAD Other        Grandmother's father  . Other Other  Mother was worked up remotely for ?parasympathetic nervous system problem but cardiac workup was reportedly unremarkable    Social History Social History   Tobacco Use  . Smoking status: Current Every Day Smoker    Packs/day: 2.00  . Smokeless tobacco: Never Used  . Tobacco comment: Smoker since age 38  Substance Use Topics  . Alcohol use: No  . Drug use: No    Comment: Remote history of opiates, heroin - clean for 8 years      Allergies   Iodinated diagnostic agents, Clindamycin/lincomycin, Other, and Sulfa antibiotics   Review of Systems Review of Systems   Physical Exam Triage Vital Signs ED Triage Vitals  Enc Vitals Group     BP 03/22/19 1625 111/77      Pulse Rate 03/22/19 1625 76     Resp 03/22/19 1625 18     Temp 03/22/19 1625 98.3 F (36.8 C)     Temp src --      SpO2 03/22/19 1625 97 %     Weight --      Height --      Head Circumference --      Peak Flow --      Pain Score 03/22/19 1627 10     Pain Loc --      Pain Edu? --      Excl. in GC? --    No data found.  Updated Vital Signs BP 111/77   Pulse 76   Temp 98.3 F (36.8 C)   Resp 18   LMP 03/17/2019   SpO2 97%    Physical Exam Constitutional:      General: She is not in acute distress.    Appearance: She is well-developed.  Cardiovascular:     Rate and Rhythm: Normal rate and regular rhythm.     Heart sounds: Normal heart sounds.  Pulmonary:     Effort: Pulmonary effort is normal.     Breath sounds: Normal breath sounds.  Abdominal:     General: Abdomen is flat.     Palpations: Abdomen is soft.     Tenderness: There is abdominal tenderness in the suprapubic area. There is no right CVA tenderness, left CVA tenderness, guarding or rebound.     Comments: Some generalized low abdominal pain without specific point tenderness  Genitourinary:    Vagina: Normal.     Cervix: Normal.     Uterus: Normal.      Comments: Scant dark red blood noted from cervix Skin:    General: Skin is warm and dry.  Neurological:     Mental Status: She is alert and oriented to person, place, and time.      UC Treatments / Results  Labs (all labs ordered are listed, but only abnormal results are displayed) Labs Reviewed  POCT URINALYSIS DIP (DEVICE) - Abnormal; Notable for the following components:      Result Value   Bilirubin Urine SMALL (*)    Hgb urine dipstick TRACE (*)    Nitrite POSITIVE (*)    All other components within normal limits  CBC WITH DIFFERENTIAL/PLATELET  COMPREHENSIVE METABOLIC PANEL  LIPASE, BLOOD  POC URINE PREG, ED  POCT PREGNANCY, URINE  CERVICOVAGINAL ANCILLARY ONLY    EKG   Radiology No results found.  Procedures Procedures  (including critical care time)  Medications Ordered in UC Medications  ibuprofen (ADVIL) tablet 800 mg (has no administration in time range)    Initial Impression / Assessment and Plan / UC  Course  I have reviewed the triage vital signs and the nursing notes.  Pertinent labs & imaging results that were available during my care of the patient were reviewed by me and considered in my medical decision making (see chart for details).     Negative for pregnancy here today, no acute abdominal findings on exam. Low suspicion for a complication from miscarriage s/p plan b. Suspect this may have simply caused irregularity to her menstrual period. Continue with antibiotics for UTI. Supportive cares. Encouraged follow up with gyne as needed. Return precautions provided. Patient verbalized understanding and agreeable to plan.   Final Clinical Impressions(s) / UC Diagnoses   Final diagnoses:  Lower abdominal pain  RUQ pain     Discharge Instructions     Plan b can cause some irregular bleeding/ period.  Negative pregnancy here today, therefore unlikely to be any pregnancy complication.  Ibuprofen, heat packs to help with pain.  Drink plenty of water.  We will collect some labs to ensure no obvious other sources of pain related to liver or gallbladder and to check your blood levels after having this bleeding.  We are also running your vaginal sample to ensure no infection present as this can contribute to vaginal bleeding.  Bland diet as tolerated.  Complete antibiotics for your UTI as prescribed.  Any worsening of symptoms please go to the ER.  Please follow up with gynecology for any lingering or persistent symptoms.    ED Prescriptions    Medication Sig Dispense Auth. Provider   ibuprofen (ADVIL) 800 MG tablet Take 1 tablet (800 mg total) by mouth 3 (three) times daily. 21 tablet Georgetta Haber, NP     Controlled Substance Prescriptions [place] Controlled Substance Registry consulted?  Not Applicable   Georgetta Haber, NP 03/23/19 [phone]

## 2019-03-22 NOTE — ED Triage Notes (Signed)
Pt states she took a plan b a week ago, then got her period for over a week, concerned she may have had a miscarriage. Never has taken plan b. Pt c/o swelling and pain in her lower abdomen. Pt was seen 9/2 and is currently being treated for a UTI.

## 2019-03-27 ENCOUNTER — Encounter (HOSPITAL_COMMUNITY): Payer: Self-pay

## 2019-03-27 ENCOUNTER — Telehealth (HOSPITAL_COMMUNITY): Payer: Self-pay | Admitting: Internal Medicine

## 2019-03-27 ENCOUNTER — Telehealth (HOSPITAL_COMMUNITY): Payer: Self-pay | Admitting: Emergency Medicine

## 2019-03-27 LAB — CERVICOVAGINAL ANCILLARY ONLY
Bacterial vaginitis: NEGATIVE
Candida vaginitis: POSITIVE — AB
Chlamydia: NEGATIVE
Neisseria Gonorrhea: NEGATIVE
Trichomonas: NEGATIVE

## 2019-03-27 MED ORDER — TERCONAZOLE 0.4 % VA CREA: 1.0000 | TOPICAL_CREAM | Freq: Every day | VAGINAL | 0 refills | Status: AC

## 2019-03-27 NOTE — Telephone Encounter (Signed)
Test for candida (yeast) was positive. Will NOT send treatment until discussed with the patient. Left voicemail to return call. Pt has possible adverse reaction if she is still on methadone. Need to confirm this with her.

## 2019-03-27 NOTE — Telephone Encounter (Signed)
Pt returned call, pt denies vaginal discharge, stating she is still having the urinary frequency. Reviewed chart with Dr. Lanny Cramp, UTI appears to be treated from repeat labs, could possibly consider symptoms from the yeast infection. Pt will try yeast medication for a day or so and if symptoms not resolving or getting worse, please return to be seen. Pt may also return if she wants recheck without waiting. Pt decided to wait. PT is also taking methadone so we will send cream.

## 2019-05-18 ENCOUNTER — Ambulatory Visit (HOSPITAL_COMMUNITY)
Admission: EM | Admit: 2019-05-18 | Discharge: 2019-05-18 | Disposition: A | Discharge status: Home / Self Care | Payer: Self-pay | Attending: Family Medicine | Admitting: Family Medicine

## 2019-05-18 ENCOUNTER — Encounter (HOSPITAL_COMMUNITY): Payer: Self-pay

## 2019-05-18 DIAGNOSIS — R300 Dysuria: Secondary | ICD-10-CM | POA: Insufficient documentation

## 2019-05-18 DIAGNOSIS — Z3202 Encounter for pregnancy test, result negative: Secondary | ICD-10-CM

## 2019-05-18 DIAGNOSIS — K047 Periapical abscess without sinus: Secondary | ICD-10-CM | POA: Insufficient documentation

## 2019-05-18 LAB — POCT URINALYSIS DIP (DEVICE)
Glucose, UA: 250 mg/dL — AB
Nitrite: POSITIVE — AB
Protein, ur: 100 mg/dL — AB
Specific Gravity, Urine: 1.015 (ref 1.005–1.030)
Urobilinogen, UA: >=8 mg/dL (ref 0.0–1.0)
pH: 6 (ref 5.0–8.0)

## 2019-05-18 LAB — POCT PREGNANCY, URINE: Preg Test, Ur: NEGATIVE

## 2019-05-18 MED ORDER — GABAPENTIN 300 MG PO CAPS: 300.0000 mg | ORAL_CAPSULE | Freq: Two times a day (BID) | ORAL | 0 refills | Status: DC

## 2019-05-18 MED ORDER — AMOXICILLIN-POT CLAVULANATE 875-125 MG PO TABS: 1.0000 | ORAL_TABLET | Freq: Two times a day (BID) | ORAL | 0 refills | Status: DC

## 2019-05-18 NOTE — Discharge Instructions (Addendum)
Follow up with your dentist.

## 2019-05-18 NOTE — ED Triage Notes (Addendum)
Pt states she is having an abscess mouth x 3 days. Pt states having dental pain x 3 days. Pt states she started  having right ear pain this morning.   Pt states she is having burning sensation when urinating, foul smelling in the urine and urinary frequency x 1 month. Pt reports she was treated with antibiotics for the the UTI and the symptoms never improved.   Pt wants an Std test.

## 2019-05-18 NOTE — ED Provider Notes (Addendum)
[facility]    CSN: [id number] Arrival date & time: 05/18/19  1116      History   Chief Complaint Chief Complaint  Patient presents with  . Dental Pain  . Abscess  . Otalgia  . Dysuria  . STD test    HPI [name] is a 38 y.o. female.   Established [facility] patient  Pt states she is having an abscess mouth x 3 days. Pt states having dental pain x 3 days. Pt states she started  having right ear pain this morning.   Pt states she is having burning sensation when urinating, foul smelling in the urine and urinary frequency x 1 month. Pt reports she was treated with antibiotics for the the UTI and the symptoms never improved.   Pt wants an Std test.  Patient wanted pain medicine called in but she is taking methadone qam 60 mg.  I told her I was uncomfortable prescribing more narcotics.  She has tried ibuprofen and ambusol.     Past Medical History:  Diagnosis Date  . Long Q-T syndrome   . PMDD (premenstrual dysphoric disorder)   . Polysubstance abuse ([place])    a. 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 ? Needs INH.  . Tobacco abuse     Patient Active Problem List   Diagnosis Date Noted  . Right ovarian cyst 04/19/2016  . 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 Abdominal pain, left lower quadrant 03/28/2016  . Tobacco abuse 03/02/2016  . Palpitations 08/21/2013  . Prolonged Q-T interval on ECG 01/28/2013    History reviewed. No pertinent surgical history.  OB History   No obstetric history on file.      Home Medications    Prior to Admission medications   Medication Sig Start Date End Date Taking? Authorizing Provider  amoxicillin-clavulanate (AUGMENTIN) 875-125 MG tablet Take 1 tablet by mouth every 12 (twelve) hours. 05/18/19   Robyn Haber, MD  diclofenac (VOLTAREN) 75 MG EC tablet Take 1 tablet (75 mg  total) by mouth 2 (two) times daily. 02/02/19   Vanessa Kick, MD  DULoxetine (CYMBALTA) 20 MG capsule Take 20 mg by mouth daily.    [provider]  gabapentin (NEURONTIN) 300 MG capsule Take 1 capsule (300 mg total) by mouth 2 (two) times daily. 05/18/19   Robyn Haber, MD  ibuprofen (ADVIL) 800 MG tablet Take 1 tablet (800 mg total) by mouth 3 (three) times daily. 03/22/19   Zigmund Gottron, NP  METHADONE HCL PO Take 20 mLs by mouth daily.     [provider]  methocarbamol (ROBAXIN) 500 MG tablet Take 1 tablet (500 mg total) by mouth 2 (two) times daily. 07/11/18   Jacqlyn Larsen, PA-C    Family History Family History  Problem Relation Age of Onset  . CAD Other        Grandmother's father  . Other Other        Mother was worked up remotely for ?parasympathetic nervous system problem but cardiac workup was reportedly unremarkable    Social History Social History   Tobacco Use  . Smoking status: Current Every Day Smoker    Packs/day: 2.00  . Smokeless tobacco: Never Used  . Tobacco comment: Smoker since age 68  Substance Use Topics  . Alcohol use: No  . Drug use: No    Comment: Remote history  of opiates, heroin - clean for 8 years      Allergies   Iodinated diagnostic agents, Clindamycin/lincomycin, Other, and Sulfa antibiotics   Review of Systems Review of Systems   Physical Exam Triage Vital Signs ED Triage Vitals  Enc Vitals Group     BP 05/18/19 1148 (!) 129/91     Pulse Rate 05/18/19 1148 89     Resp 05/18/19 1148 16     Temp 05/18/19 1148 99.3 F (37.4 C)     Temp Source 05/18/19 1148 Oral     SpO2 05/18/19 1148 98 %     Weight --      Height --      Head Circumference --      Peak Flow --      Pain Score 05/18/19 1147 9     Pain Loc --      Pain Edu? --      Excl. in GC? --    No data found.  Updated Vital Signs BP (!) 129/91 (BP Location: Right Arm)   Pulse 89   Temp 99.3 F (37.4 C) (Oral)   Resp 16   SpO2 98%     Physical Exam Vitals signs and nursing note reviewed.  Constitutional:      General: She is not in acute distress.    Appearance: Normal appearance. She is normal weight. She is not toxic-appearing.  HENT:     Head: Normocephalic.     Right Ear: Tympanic membrane normal.     Nose: Nose normal.     Mouth/Throat:     Comments: Diffuse gingivitis with some swelling buccal mucosa tooth #25 Eyes:     Conjunctiva/sclera: Conjunctivae normal.  Neck:     Musculoskeletal: Normal range of motion and neck supple.  Cardiovascular:     Rate and Rhythm: Normal rate.     Pulses: Normal pulses.  Pulmonary:     Effort: Pulmonary effort is normal.  Abdominal:     General: Abdomen is flat.     Palpations: There is no mass.     Tenderness: There is no abdominal tenderness. There is no right CVA tenderness, left CVA tenderness, guarding or rebound.     Comments: Patient says she has chronic flank aching, but does not appear uncomfortable with flank palpation or abdominal palpation  Musculoskeletal: Normal range of motion.  Skin:    General: Skin is warm and dry.  Neurological:     General: No focal deficit present.     Mental Status: She is alert and oriented to person, place, and time.  Psychiatric:        Mood and Affect: Mood normal.        Behavior: Behavior normal.      UC Treatments / Results  Labs (all labs ordered are listed, but only abnormal results are displayed) Labs Reviewed  POCT URINALYSIS DIP (DEVICE) - Abnormal; Notable for the following components:      Result Value   Glucose, UA 250 (*)    Bilirubin Urine SMALL (*)    Ketones, ur TRACE (*)    Hgb urine dipstick TRACE (*)    Protein, ur 100 (*)    Nitrite POSITIVE (*)    Leukocytes,Ua LARGE (*)    All other components within normal limits  URINE CULTURE  POC URINE PREG, ED  POCT PREGNANCY, URINE  CERVICOVAGINAL ANCILLARY ONLY    EKG   Radiology No results found.  Procedures Procedures (including critical  care time)  Medications Ordered in UC Medications - No data to display  Initial Impression / Assessment and Plan / UC Course  I have reviewed the triage vital signs and the nursing notes.  Pertinent labs & imaging results that were available during my care of the patient were reviewed by me and considered in my medical decision making (see chart for details).    Final Clinical Impressions(s) / UC Diagnoses   Final diagnoses:  Dental abscess  Dysuria     Discharge Instructions     Follow up with your dentist    ED Prescriptions    Medication Sig Dispense Auth. Provider   amoxicillin-clavulanate (AUGMENTIN) 875-125 MG tablet Take 1 tablet by mouth every 12 (twelve) hours. 14 tablet Elvina SidleLauenstein, Torrin Crihfield, MD   gabapentin (NEURONTIN) 300 MG capsule Take 1 capsule (300 mg total) by mouth 2 (two) times daily. 15 capsule Elvina SidleLauenstein, Teretha Chalupa, MD     I have reviewed the PDMP during this encounter.   Elvina SidleLauenstein, Nerea Bordenave, MD 05/18/19 1220    Elvina SidleLauenstein, Jovahn Breit, MD 05/18/19 1228

## 2019-05-20 LAB — URINE CULTURE: Culture: >=100000 — AB

## 2019-05-21 LAB — CERVICOVAGINAL ANCILLARY ONLY
Bacterial vaginitis: POSITIVE — AB
Chlamydia: NEGATIVE
Neisseria Gonorrhea: NEGATIVE
Trichomonas: NEGATIVE

## 2019-05-23 ENCOUNTER — Telehealth: Payer: Self-pay | Admitting: Emergency Medicine

## 2019-05-23 MED ORDER — METRONIDAZOLE 500 MG PO TABS: 500.0000 mg | ORAL_TABLET | Freq: Two times a day (BID) | ORAL | 0 refills | Status: AC

## 2019-05-23 NOTE — Telephone Encounter (Signed)
Bacterial vaginosis is positive. This was not treated at the urgent care visit.  Flagyl 500 mg BID x 7 days #14 no refills sent to patients pharmacy of choice.    Urine culture was positive for ESCHERICHIA COLI and was given augmentin  at urgent care visit. Per traci, this may cover for E coli, however if pt still symptomatic after finishing, we can send her another antibiotic specifically for her UTI.  Attempted to reach patient. No answer at this time. Voicemail left.   Will send Mychart message.

## 2019-05-24 ENCOUNTER — Telehealth (HOSPITAL_COMMUNITY): Payer: Self-pay | Admitting: Emergency Medicine

## 2019-05-24 NOTE — Telephone Encounter (Signed)
Attempted to reach patient x2. No answer at this time. Voicemail left.    

## 2019-05-29 ENCOUNTER — Encounter (HOSPITAL_COMMUNITY): Payer: Self-pay | Admitting: Emergency Medicine

## 2019-05-29 ENCOUNTER — Emergency Department (HOSPITAL_COMMUNITY)
Admission: EM | Admit: 2019-05-29 | Discharge: 2019-05-29 | Disposition: A | Discharge status: Home / Self Care | Payer: No Typology Code available for payment source | Attending: Emergency Medicine | Admitting: Emergency Medicine

## 2019-05-29 DIAGNOSIS — F17210 Nicotine dependence, cigarettes, uncomplicated: Secondary | ICD-10-CM | POA: Insufficient documentation

## 2019-05-29 DIAGNOSIS — Z79899 Other long term (current) drug therapy: Secondary | ICD-10-CM | POA: Diagnosis not present

## 2019-05-29 DIAGNOSIS — S36113D Laceration of liver, unspecified degree, subsequent encounter: Secondary | ICD-10-CM | POA: Insufficient documentation

## 2019-05-29 DIAGNOSIS — R945 Abnormal results of liver function studies: Secondary | ICD-10-CM | POA: Insufficient documentation

## 2019-05-29 DIAGNOSIS — R748 Abnormal levels of other serum enzymes: Secondary | ICD-10-CM

## 2019-05-29 LAB — CBC
HCT: 40.8 % (ref 36.0–46.0)
Hemoglobin: 13.3 g/dL (ref 12.0–15.0)
MCH: 31.1 pg (ref 26.0–34.0)
MCHC: 32.6 g/dL (ref 30.0–36.0)
MCV: 95.3 fL (ref 80.0–100.0)
Platelets: 457 10*3/uL — ABNORMAL HIGH (ref 150–400)
RBC: 4.28 MIL/uL (ref 3.87–5.11)
RDW: 12.3 % (ref 11.5–15.5)
WBC: 11.2 10*3/uL — ABNORMAL HIGH (ref 4.0–10.5)
nRBC: 0 % (ref 0.0–0.2)

## 2019-05-29 LAB — COMPREHENSIVE METABOLIC PANEL
ALT: 203 U/L — ABNORMAL HIGH (ref 0–44)
AST: 131 U/L — ABNORMAL HIGH (ref 15–41)
Albumin: 3.2 g/dL — ABNORMAL LOW (ref 3.5–5.0)
Alkaline Phosphatase: 98 U/L (ref 38–126)
Anion gap: 10 (ref 5–15)
BUN: 5 mg/dL — ABNORMAL LOW (ref 6–20)
CO2: 26 mmol/L (ref 22–32)
Calcium: 8.6 mg/dL — ABNORMAL LOW (ref 8.9–10.3)
Chloride: 98 mmol/L (ref 98–111)
Creatinine, Ser: 0.61 mg/dL (ref 0.44–1.00)
GFR calc Af Amer: >60 mL/min (ref >60)
GFR calc non Af Amer: >60 mL/min (ref >60)
Glucose, Bld: 102 mg/dL — ABNORMAL HIGH (ref 70–99)
Potassium: 4 mmol/L (ref 3.5–5.1)
Sodium: 134 mmol/L — ABNORMAL LOW (ref 135–145)
Total Bilirubin: 0.7 mg/dL (ref 0.3–1.2)
Total Protein: 6.8 g/dL (ref 6.5–8.1)

## 2019-05-29 MED ORDER — OXYCODONE-ACETAMINOPHEN 5-325 MG PO TABS: 1.0000 | ORAL_TABLET | Freq: Once | ORAL | Status: DC

## 2019-05-29 MED ORDER — OXYCODONE HCL 5 MG PO TABS
5.00 | ORAL_TABLET | ORAL | Status: DC
Start: ? — End: 2019-05-29

## 2019-05-29 MED ORDER — OXYCODONE HCL 5 MG PO TABS: 2.5000 mg | ORAL_TABLET | ORAL | 0 refills | Status: DC | PRN

## 2019-05-29 NOTE — ED Notes (Signed)
Pt ambulated to and from bathroom independently
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
## 2019-05-29 NOTE — Discharge Instructions (Addendum)
Contact a health care provider if: Your abdominal pain does not go away. You feel more weak and tired than usual. Get help right away if: Your abdominal pain gets worse. You have a cut on your skin that: Has more redness, swelling, or pain around it. Has more fluid or blood coming from it. Feels warm to the touch. Has pus or a bad smell coming from it. You feel dizzy or very weak. You have trouble breathing. You have a fever.
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
## 2019-05-29 NOTE — ED Notes (Signed)
Pt verbalized understanding of discharge instructions. Pain management and prescriptions reviewed, pt has no further questions at this time.

## 2019-06-01 ENCOUNTER — Encounter (HOSPITAL_COMMUNITY): Payer: Self-pay | Admitting: *Deleted

## 2019-06-01 ENCOUNTER — Ambulatory Visit (HOSPITAL_COMMUNITY)
Admission: EM | Admit: 2019-06-01 | Discharge: 2019-06-01 | Disposition: A | Discharge status: Home / Self Care | Payer: Self-pay | Attending: Internal Medicine | Admitting: Internal Medicine

## 2019-06-01 ENCOUNTER — Other Ambulatory Visit: Payer: Self-pay

## 2019-06-01 DIAGNOSIS — Z79899 Other long term (current) drug therapy: Secondary | ICD-10-CM | POA: Insufficient documentation

## 2019-06-01 DIAGNOSIS — Y95 Nosocomial condition: Secondary | ICD-10-CM | POA: Insufficient documentation

## 2019-06-01 DIAGNOSIS — J189 Pneumonia, unspecified organism: Secondary | ICD-10-CM

## 2019-06-01 DIAGNOSIS — Z20828 Contact with and (suspected) exposure to other viral communicable diseases: Secondary | ICD-10-CM | POA: Insufficient documentation

## 2019-06-01 DIAGNOSIS — Y92410 Unspecified street and highway as the place of occurrence of the external cause: Secondary | ICD-10-CM | POA: Insufficient documentation

## 2019-06-01 DIAGNOSIS — R0781 Pleurodynia: Secondary | ICD-10-CM | POA: Insufficient documentation

## 2019-06-01 DIAGNOSIS — R05 Cough: Secondary | ICD-10-CM | POA: Insufficient documentation

## 2019-06-01 DIAGNOSIS — F17210 Nicotine dependence, cigarettes, uncomplicated: Secondary | ICD-10-CM | POA: Insufficient documentation

## 2019-06-01 HISTORY — DX: Minor contusion of unspecified kidney, initial encounter: S37.019A

## 2019-06-01 HISTORY — DX: Multiple fractures of ribs, unspecified side, initial encounter for closed fracture: S22.49XA

## 2019-06-01 MED ORDER — DOXYCYCLINE HYCLATE 100 MG PO CAPS: 100.0000 mg | ORAL_CAPSULE | Freq: Two times a day (BID) | ORAL | 0 refills | Status: DC

## 2019-06-01 MED ORDER — OXYCODONE-ACETAMINOPHEN 5-325 MG PO TABS: 2.0000 | ORAL_TABLET | Freq: Four times a day (QID) | ORAL | 0 refills | Status: DC | PRN
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
## 2019-06-01 NOTE — Discharge Instructions (Signed)
IF your COVID test is positive, CALL to make an appointment.  You may be directed to the hospital based on symptoms.  Do NOT walk into the Emergency Department or public spaces where you may infect other people.
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
## 2019-06-01 NOTE — ED Notes (Signed)
No response from waiting area. 

## 2019-06-02 LAB — NOVEL CORONAVIRUS, NAA (HOSP ORDER, SEND-OUT TO REF LAB; TAT 18-24 HRS): SARS-CoV-2, NAA: NOT DETECTED

## 2019-10-16 ENCOUNTER — Telehealth: Payer: Self-pay | Admitting: Internal Medicine

## 2019-10-16 NOTE — Telephone Encounter (Signed)
Attempted to call Pt.  Phone number documented is not a working number.  Per [facility] policy we are not writing letters documenting risk level for covid.

## 2019-10-16 NOTE — Telephone Encounter (Signed)
[name] is calling stating her attorney is requesting a letter from Dr. Ladona Ridgel stating she is at high risk for Covid due to her heart condition. Please advise.

## 2019-10-18 NOTE — Telephone Encounter (Signed)
Call back received from Pt.  Advised could not write a letter describing Pt as high risk for covid.  Pt requests a letter be prepared explaining Dr. Lubertha Basque care for her.  Letter created and emailed as requested.

## 2019-10-18 NOTE — Telephone Encounter (Signed)
Follow up  Patient returning call. Transferred call to [facility].

## 2019-10-22 ENCOUNTER — Telehealth: Payer: Self-pay | Admitting: Internal Medicine

## 2019-10-22 NOTE — Telephone Encounter (Signed)
Follow Up:    Pt said she did not receive the letter in her e-mail. Please resend to [email or web address].

## 2019-10-22 NOTE — Telephone Encounter (Signed)
Correct email is [email or web address].

## 2019-10-23 NOTE — Telephone Encounter (Signed)
Edited Pt letter per her request.  Reemailed.

## 2019-10-27 ENCOUNTER — Ambulatory Visit (HOSPITAL_COMMUNITY)
Admission: EM | Admit: 2019-10-27 | Discharge: 2019-10-27 | Disposition: A | Discharge status: Home / Self Care | Payer: Self-pay | Attending: Family Medicine | Admitting: Family Medicine

## 2019-10-27 ENCOUNTER — Encounter (HOSPITAL_COMMUNITY): Payer: Self-pay

## 2019-10-27 DIAGNOSIS — S46911A Strain of unspecified muscle, fascia and tendon at shoulder and upper arm level, right arm, initial encounter: Secondary | ICD-10-CM

## 2019-10-27 MED ORDER — HYDROCODONE-ACETAMINOPHEN 5-325 MG PO TABS: 1.0000 | ORAL_TABLET | Freq: Four times a day (QID) | ORAL | 0 refills | Status: DC | PRN

## 2019-10-27 MED ORDER — TIZANIDINE HCL 4 MG PO TABS: 4.0000 mg | ORAL_TABLET | Freq: Four times a day (QID) | ORAL | 0 refills | Status: DC | PRN
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
## 2019-10-27 NOTE — Discharge Instructions (Signed)
Continue the ibuprofen/naproxen as needed Add tizanidine muscle relaxer Take the hydrocodone if pain severe Ice to area Gentle stretching Return as needed

## 2019-10-27 NOTE — ED Provider Notes (Signed)
[facility]    CSN: [id number] Arrival date & time: 10/27/19  1448      History   Chief Complaint Chief Complaint  Patient presents with  . Shoulder Injury    HPI [name] is a 39 y.o. female.   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  Today she was walking a [place] who took off suddenly and yanked her left arm.  The arm was jerked away from her body forcibly.  She stumbled but did not fall.  She has acute shoulder pain.  Pain with movement of her shoulder.  No prior shoulder problems.  Past Medical History:  Diagnosis Date  . Kidney contusion   . Long Q-T syndrome   . PMDD (premenstrual dysphoric disorder)   . Polysubstance abuse (HCC)    a. 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 ? Needs INH.  Marland Kitchen Rib fractures   . Tobacco abuse     Patient Active Problem List   Diagnosis Date Noted  . Right ovarian cyst 04/19/2016  . Methadone maintenance therapy patient (HCC) 03/28/2016  . Abdominal pain, left lower quadrant 03/28/2016  . Tobacco abuse 03/02/2016  . Palpitations 08/21/2013    History reviewed. No pertinent surgical history.  OB History   No obstetric history on file.      Home Medications    Prior to Admission medications   Medication Sig Start Date End Date Taking? Authorizing Provider  ibuprofen (ADVIL) 800 MG tablet Take 1 tablet (800 mg total) by mouth 3 (three) times daily. 03/22/19  Yes Burky, Dorene Grebe B, NP  HYDROcodone-acetaminophen (NORCO/VICODIN) 5-325 MG tablet Take 1-2 tablets by mouth every 6 (six) hours as needed. 10/27/19   Eustace Moore, MD  tiZANidine (ZANAFLEX) 4 MG tablet Take 1-2 tablets (4-8 mg total) by mouth every 6 (six) hours as needed for muscle spasms. 10/27/19   Eustace Moore, MD  UNKNOWN TO PATIENT BCPs    [provider]  DULoxetine (CYMBALTA) 20 MG  capsule Take 20 mg by mouth daily.  10/27/19  [provider]  gabapentin (NEURONTIN) 300 MG capsule Take 1 capsule (300 mg total) by mouth 2 (two) times daily. 05/18/19 10/27/19  Elvina Sidle, MD    Family History Family History  Problem Relation Age of Onset  . Healthy Mother   . Healthy Father   . CAD Other        Grandmother's father  . Other Other        Mother was worked up remotely for ?parasympathetic nervous system problem but cardiac workup was reportedly unremarkable    Social History Social History   Tobacco Use  . Smoking status: Current Every Day Smoker    Packs/day: 2.00  . Smokeless tobacco: Never Used  . Tobacco comment: Smoker since age 39  Substance Use Topics  . Alcohol use: No  . Drug use: No    Comment: Remote history of opiates, heroin - clean for 8 years      Allergies   Iodinated diagnostic agents, Clindamycin/lincomycin, Other, and Sulfa antibiotics   Review of Systems Review of Systems  Musculoskeletal: Positive for arthralgias.     Physical Exam Triage Vital Signs ED Triage Vitals  Enc Vitals Group     BP 10/27/19 1504 105/70     Pulse Rate 10/27/19 1504 79  Resp 10/27/19 1504 18     Temp 10/27/19 1504 98.3 F (36.8 C)     Temp src --      SpO2 10/27/19 1504 100 %     Weight --      Height --      Head Circumference --      Peak Flow --      Pain Score 10/27/19 1502 10     Pain Loc --      Pain Edu? --      Excl. in GC? --    No data found.  Updated Vital Signs BP 105/70   Pulse 79   Temp 98.3 F (36.8 C)   Resp 18   LMP 10/25/2019   SpO2 100%     Physical Exam Constitutional:      General: She is not in acute distress.    Appearance: She is well-developed and normal weight.     Comments: Appears uncomfortable.  Holds left arm stiffly  HENT:     Head: Normocephalic and atraumatic.     Mouth/Throat:     Comments: Mask is in place Eyes:     Conjunctiva/sclera: Conjunctivae normal.     Pupils:  Pupils are equal, round, and reactive to light.  Cardiovascular:     Rate and Rhythm: Normal rate.  Pulmonary:     Effort: Pulmonary effort is normal. No respiratory distress.  Abdominal:     General: There is no distension.     Palpations: Abdomen is soft.  Musculoskeletal:        General: Normal range of motion.     Cervical back: Normal range of motion.     Comments: There is tenderness in the left upper body of the trapezius and the paraspinous neck muscles.  Mild tenderness along the rhomboid, medial border of scapula.  No tenderness right shoulder joint.  Limited range of motion secondary to pain limiting behavior.  Elbow wrist and hand motion are normal with good grip strength bilaterally  Skin:    General: Skin is warm and dry.  Neurological:     Mental Status: She is alert.  Psychiatric:        Mood and Affect: Mood normal.        Behavior: Behavior normal.      UC Treatments / Results  Labs (all labs ordered are listed, but only abnormal results are displayed) Labs Reviewed - No data to display  EKG   Radiology No results found.  Procedures Procedures (including critical care time)  Medications Ordered in UC Medications - No data to display  Initial Impression / Assessment and Plan / UC Course  I have reviewed the triage vital signs and the nursing notes.  Pertinent labs & imaging results that were available during my care of the patient were reviewed by me and considered in my medical decision making (see chart for details).     Patient states that she has been taking Advil and Aleve with no improvement in her pain.  She specifically states that she would like some stronger pain medication.  I reviewed her chart and see that she is a polysubstance abuser in the past.  She was on methadone for many years but has gotten off of it for the last several months.  Is not currently in a pain contract.  I cautioned her against taking pain medication anymore  well-trained drug.  She states that she feels able to control her urges after so many years  of rehab and treatment.  She is given hydrocodone, #10 only.  She understands this will not be refilled Final Clinical Impressions(s) / UC Diagnoses   Final diagnoses:  Shoulder strain, right, initial encounter     Discharge Instructions     Continue the ibuprofen/naproxen as needed Add tizanidine muscle relaxer Take the hydrocodone if pain severe Ice to area Gentle stretching Return as needed   ED Prescriptions    Medication Sig Dispense Auth. Provider   tiZANidine (ZANAFLEX) 4 MG tablet Take 1-2 tablets (4-8 mg total) by mouth every 6 (six) hours as needed for muscle spasms. 21 tablet Raylene Everts, MD   HYDROcodone-acetaminophen (NORCO/VICODIN) 5-325 MG tablet Take 1-2 tablets by mouth every 6 (six) hours as needed. 10 tablet Raylene Everts, MD     I have reviewed the PDMP during this encounter.   Raylene Everts, MD 10/27/19 [phone]

## 2020-04-11 ENCOUNTER — Encounter (HOSPITAL_COMMUNITY): Payer: Self-pay | Admitting: Emergency Medicine

## 2020-04-11 ENCOUNTER — Other Ambulatory Visit: Payer: Self-pay

## 2020-04-11 ENCOUNTER — Emergency Department (HOSPITAL_COMMUNITY)
Admission: EM | Admit: 2020-04-11 | Discharge: 2020-04-11 | Disposition: A | Discharge status: Home / Self Care | Payer: Self-pay | Attending: Emergency Medicine | Admitting: Emergency Medicine

## 2020-04-11 DIAGNOSIS — F17200 Nicotine dependence, unspecified, uncomplicated: Secondary | ICD-10-CM | POA: Insufficient documentation

## 2020-04-11 DIAGNOSIS — F10129 Alcohol abuse with intoxication, unspecified: Secondary | ICD-10-CM | POA: Insufficient documentation

## 2020-04-11 DIAGNOSIS — F10929 Alcohol use, unspecified with intoxication, unspecified: Secondary | ICD-10-CM

## 2020-04-11 LAB — CBC
HCT: 42.6 % (ref 36.0–46.0)
Hemoglobin: 14.5 g/dL (ref 12.0–15.0)
MCH: 31.5 pg (ref 26.0–34.0)
MCHC: 34 g/dL (ref 30.0–36.0)
MCV: 92.6 fL (ref 80.0–100.0)
Platelets: 352 10*3/uL (ref 150–400)
RBC: 4.6 MIL/uL (ref 3.87–5.11)
RDW: 14.6 % (ref 11.5–15.5)
WBC: 6.2 10*3/uL (ref 4.0–10.5)
nRBC: 0 % (ref 0.0–0.2)

## 2020-04-11 LAB — BASIC METABOLIC PANEL
Anion gap: 13 (ref 5–15)
BUN: <5 mg/dL — ABNORMAL LOW (ref 6–20)
CO2: 25 mmol/L (ref 22–32)
Calcium: 8.7 mg/dL — ABNORMAL LOW (ref 8.9–10.3)
Chloride: 106 mmol/L (ref 98–111)
Creatinine, Ser: 0.72 mg/dL (ref 0.44–1.00)
GFR calc Af Amer: >60 mL/min (ref >60)
GFR calc non Af Amer: >60 mL/min (ref >60)
Glucose, Bld: 94 mg/dL (ref 70–99)
Potassium: 3.5 mmol/L (ref 3.5–5.1)
Sodium: 144 mmol/L (ref 135–145)

## 2020-04-11 LAB — HCG, QUANTITATIVE, PREGNANCY: hCG, Beta Chain, Quant, S: <1 m[IU]/mL (ref <5)

## 2020-04-11 LAB — ETHANOL: Alcohol, Ethyl (B): 238 mg/dL — ABNORMAL HIGH (ref <10)

## 2020-04-11 LAB — MAGNESIUM: Magnesium: 2.3 mg/dL (ref 1.7–2.4)

## 2020-04-11 NOTE — ED Notes (Signed)
Called lab to follow up on pending mag level. Informed they are still working on it.

## 2020-04-11 NOTE — Discharge Instructions (Addendum)
Follow-up with outpatient substance abuse facilities as provided in this discharge summary.

## 2020-04-11 NOTE — ED Provider Notes (Signed)
[facility] Emergency Department Provider Note MRN:  [id number]  Arrival date & time: 04/11/20     Chief Complaint   Alcohol Intoxication   History of Present Illness   [name] is a 39 y.o. year-old female with a history of polysubstance abuse presenting to the ED with chief complaint of intoxication.  Patient reportedly drank alcohol with her methadone this morning.  She is somnolent.  I was unable to obtain an accurate HPI, PMH, or ROS due to the patient's intoxication.  Level 5 caveat.   Review of Systems  Positive for intoxication.  Patient's Health History    Past Medical History:  Diagnosis Date  . Kidney contusion   . Long Q-T syndrome   . PMDD (premenstrual dysphoric disorder)   . Polysubstance abuse (HCC)    a. 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 ? Needs INH.  Marland Kitchen Rib fractures   . Tobacco abuse     History reviewed. No pertinent surgical history.  Family History  Problem Relation Age of Onset  . Healthy Mother   . Healthy Father   . CAD Other        Grandmother's father  . Other Other        Mother was worked up remotely for ?parasympathetic nervous system problem but cardiac workup was reportedly unremarkable    Social History   Socioeconomic History  . Marital status: Single    Spouse name: Not on file  . Number of children: Not on file  . Years of education: Not on file  . Highest education level: Not on file  Occupational History  . Not on file  Tobacco Use  . Smoking status: Current Every Day Smoker    Packs/day: 2.00  . Smokeless tobacco: Never Used  . Tobacco comment: Smoker since age 51  Vaping Use  . Vaping Use: Never used  Substance and Sexual Activity  . Alcohol use: No  . Drug use: No    Comment: Remote history of opiates, heroin - clean for 8 years   . Sexual activity: Yes    Birth  control/protection: Pill  Other Topics Concern  . Not on file  Social History Narrative  . Not on file   Social Determinants of Health   Financial Resource Strain:   . Difficulty of Paying Living Expenses: Not on file  Food Insecurity:   . Worried About Programme researcher, broadcasting/film/video in the Last Year: Not on file  . Ran Out of Food in the Last Year: Not on file  Transportation Needs:   . Lack of Transportation (Medical): Not on file  . Lack of Transportation (Non-Medical): Not on file  Physical Activity:   . Days of Exercise per Week: Not on file  . Minutes of Exercise per Session: Not on file  Stress:   . Feeling of Stress : Not on file  Social Connections:   . Frequency of Communication with Friends and Family: Not on file  . Frequency of Social Gatherings with Friends and Family: Not on file  . Attends Religious Services: Not on file  . Active Member of Clubs or Organizations: Not on file  . Attends Banker Meetings: Not on file  . Marital Status: Not on file  Intimate Partner Violence:   . Fear of Current or Ex-Partner: Not on file  . Emotionally Abused: Not on  file  . Physically Abused: Not on file  . Sexually Abused: Not on file     Physical Exam   Vitals:   04/11/20 0447  BP: 104/71  Pulse: (!) 58  Resp: 16  Temp: 98 F (36.7 C)  SpO2: 95%    CONSTITUTIONAL:  Chronically-appearing, NAD NEURO:  Alert and oriented x 3, no focal deficits EYES:  eyes equal and reactive ENT/NECK:  no LAD, no JVD CARDIO:  regular rate, well-perfused, normal S1 and S2 PULM:  CTAB no wheezing or rhonchi GI/GU:  normal bowel sounds, non-distended, non-tender MSK/SPINE:  No gross deformities, no edema SKIN:  no rash, atraumatic PSYCH:  Appropriate speech and behavior  *Additional and/or pertinent findings included in MDM below  Diagnostic and Interventional Summary    EKG Interpretation  Date/Time:  Saturday April 11 2020 06:10:30 EDT Ventricular Rate:  74 PR  Interval:    QRS Duration: 72 QT Interval:  499 QTC Calculation: 554 R Axis:   117 Text Interpretation: Sinus rhythm Ventricular trigeminy Abnormal lateral Q waves Anteroseptal infarct, age indeterminate Prolonged QT interval Confirmed by Kennis Carina [phone]) on 04/11/2020 6:14:50 AM      Labs Reviewed  ETHANOL - Abnormal; Notable for the following components:      Result Value   Alcohol, Ethyl (B) 238 (*)    All other components within normal limits  BASIC METABOLIC PANEL - Abnormal; Notable for the following components:   BUN <5 (*)    Calcium 8.7 (*)    All other components within normal limits  CBC  MAGNESIUM    No orders to display    Medications - No data to display   Procedures  /  Critical Care .1-3 Lead EKG Interpretation Performed by: Sabas Sous, MD Authorized by: Sabas Sous, MD     Interpretation: abnormal     ECG rate:  74   ECG rate assessment: normal     Rhythm: sinus rhythm     Ectopy: trigeminy   Comments:     Cardiac monitoring was ordered to monitor the patient for dysrhythmia.  I personally interpreted the patient's cardiac monitor while at the bedside.     ED Course and Medical Decision Making  I have reviewed the triage vital signs, the nursing notes, and pertinent available records from the EMR.  Listed above are laboratory and imaging tests that I personally ordered, reviewed, and interpreted and then considered in my medical decision making (see below for details).  Reportedly intoxicated with alcohol and methadone.  Adequate respirations, normal vital signs, no evidence of trauma.  Will place on continuous pulse ox, and reassess.     Upon reassessment patient continues to be quite somnolent, responds with purposeful movements to painful stimuli. Ethanol level quite elevated, will need further time for metabolization and then reassessment at that time. Patient is demonstrating some ectopy on the cardiac monitor. Has a history of long  QT. Patient remains in sinus rhythm with ventricular trigeminy, will monitor closely, I have added on a magnesium level but the labs thus far reassuring. Signed out to oncoming provider at shift change.  Elmer Sow. Pilar Plate, MD [facility] Emergency Medicine [facility] [email or web address]  Final Clinical Impressions(s) / ED Diagnoses     ICD-10-CM   1. Alcoholic intoxication with complication [facility])  N23.557     ED Discharge Orders    None       Discharge Instructions Discussed with and Provided to Patient:   Discharge  Instructions   None       Sabas Sous, MD 04/11/20 [phone]

## 2020-04-11 NOTE — ED Triage Notes (Signed)
Patient arrives somnolent. EMS called out due to patient being somnolent. Patient states she drank alcohol and took her methadone. Per PD, patient would fall asleep in the middle of conversations.

## 2020-04-11 NOTE — ED Provider Notes (Signed)
Care assumed from Dr. Pilar Plate at shift change.  Patient apparently consumed alcohol yesterday evening and was brought here altered.  Patient has been somnolent for the first 4 hours of my shift.  She is now awake and angry/aggressive.  She is insisting upon receiving a dose of methadone and staying in the hospital.  Patient does not appear to be actively withdrawing.  Her vitals are stable.  I see no indication to admit her.  Patient will be discharged and is to follow-up with her methadone clinic.   Geoffery Lyons, MD 04/11/20 1154

## 2020-04-11 NOTE — ED Notes (Signed)
ED Provider at bedside. 

## 2021-01-26 ENCOUNTER — Ambulatory Visit (HOSPITAL_COMMUNITY): Admission: EM | Admit: 2021-01-26 | Discharge: 2021-01-26 | Discharge status: Against Medical Advice | Payer: Self-pay

## 2021-01-26 ENCOUNTER — Encounter (HOSPITAL_COMMUNITY): Payer: Self-pay

## 2021-01-26 ENCOUNTER — Other Ambulatory Visit: Payer: Self-pay

## 2021-01-26 DIAGNOSIS — M79601 Pain in right arm: Secondary | ICD-10-CM

## 2021-01-26 DIAGNOSIS — R55 Syncope and collapse: Secondary | ICD-10-CM

## 2021-01-26 DIAGNOSIS — W19XXXA Unspecified fall, initial encounter: Secondary | ICD-10-CM

## 2021-01-26 DIAGNOSIS — S0990XA Unspecified injury of head, initial encounter: Secondary | ICD-10-CM

## 2021-01-26 NOTE — ED Notes (Signed)
Patient is being discharged from the Urgent Care and sent to the Emergency Department via EMT . Per Henrene Dodge NP, patient is in need of higher level of care due to dizziness, fall, LOC. Patient is aware and verbalizes understanding of plan of care.  Vitals:   01/26/21 1431  BP: 111/65  Pulse: 100  Resp: 18  Temp: 99.4 F (37.4 C)  SpO2: 99%

## 2021-01-26 NOTE — ED Notes (Addendum)
Pt left AMA and declined transport to ER via EMT.   Pt accompanied by a female.   Medical Provider, Henrene Dodge NP, fully aware of patient conditions and chief complaints.

## 2021-01-26 NOTE — ED Notes (Signed)
Pt presenting now with slurred speech.

## 2021-01-26 NOTE — ED Notes (Signed)
911 called to transport patient to [facility] ER.

## 2021-01-26 NOTE — ED Notes (Signed)
Called patient from the waiting for evaluation no answer.

## 2021-01-26 NOTE — ED Provider Notes (Signed)
[facility]    CSN: [id number] Arrival date & time: 01/26/21  1255      History   Chief Complaint Chief Complaint  Patient presents with   Altered Mental Status   Loss of Consciousness   Fall   Arm Pain    Right/from fall   Arm Injury    HPI [name] is a 40 y.o. female.   Patient presents to the urgent care for a fall from a 5 foot wall that resulted in right arm pain and loss of consciousness after hitting her head.  Patient states that she had a misstep, felt dizzy, and fell and landed on her right arm.  Patient is unable to explain where the wall was or how she got onto the wall.  Patient told medical assistant that fall occurred 1 month ago but told provider that it occurred today.  Also reports that she has had intermittent symptoms of syncope that last "8 hours at a time" for approximately 1 month.  Denies any headaches, blurred vision, nausea, vomiting, dizziness.   Altered Mental Status Loss of Consciousness Fall  Arm Pain  Arm Injury  Past Medical History:  Diagnosis Date   Kidney contusion    Long Q-T syndrome    PMDD (premenstrual dysphoric disorder)    Polysubstance abuse (HCC)    a. 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 ? Needs INH.   Rib fractures    Tobacco abuse     Patient Active Problem List   Diagnosis Date Noted   Right ovarian cyst 04/19/2016   Methadone maintenance therapy patient (HCC) 03/28/2016   Abdominal pain, left lower quadrant 03/28/2016   Tobacco abuse 03/02/2016   Palpitations 08/21/2013    History reviewed. No pertinent surgical history.  OB History   No obstetric history on file.      Home Medications    Prior to Admission medications   Medication Sig Start Date End Date Taking? Authorizing Provider  HYDROcodone-acetaminophen (NORCO/VICODIN) 5-325 MG tablet Take 1-2 tablets by mouth every  6 (six) hours as needed. 10/27/19   Eustace Moore, MD  ibuprofen (ADVIL) 800 MG tablet Take 1 tablet (800 mg total) by mouth 3 (three) times daily. 03/22/19   Georgetta Haber, NP  tiZANidine (ZANAFLEX) 4 MG tablet Take 1-2 tablets (4-8 mg total) by mouth every 6 (six) hours as needed for muscle spasms. 10/27/19   Eustace Moore, MD  UNKNOWN TO PATIENT BCPs    [provider]  DULoxetine (CYMBALTA) 20 MG capsule Take 20 mg by mouth daily.  10/27/19  [provider]  gabapentin (NEURONTIN) 300 MG capsule Take 1 capsule (300 mg total) by mouth 2 (two) times daily. 05/18/19 10/27/19  Elvina Sidle, MD    Family History Family History  Problem Relation Age of Onset   Healthy Mother    Healthy Father    CAD Other        Grandmother's father   Other Other        Mother was worked up remotely for ?parasympathetic nervous system problem but cardiac workup was reportedly unremarkable    Social History Social History   Tobacco Use   Smoking status: Every Day    Packs/day: 2.00    Pack years: 0.00    Types: Cigarettes   Smokeless tobacco: Never   Tobacco comments:    Smoker since  age 23  Vaping Use   Vaping Use: Never used  Substance Use Topics   Alcohol use: Yes   Drug use: No    Comment: Remote history of opiates, heroin - clean for 8 years      Allergies   Iodinated diagnostic agents, Clindamycin/lincomycin, Other, and Sulfa antibiotics   Review of Systems Review of Systems  Cardiovascular:  Positive for syncope.   Per HPI Physical Exam Triage Vital Signs ED Triage Vitals  Enc Vitals Group     BP 01/26/21 1431 111/65     Pulse Rate 01/26/21 1431 100     Resp 01/26/21 1431 18     Temp 01/26/21 1431 99.4 F (37.4 C)     Temp Source 01/26/21 1431 Oral     SpO2 01/26/21 1431 99 %     Weight --      Height --      Head Circumference --      Peak Flow --      Pain Score 01/26/21 1426 10     Pain Loc --      Pain Edu? --      Excl. in GC? --     No data found.  Updated Vital Signs BP 111/65 (BP Location: Right Arm)   Pulse 100   Temp 99.4 F (37.4 C) (Oral)   Resp 18   LMP  (LMP Unknown)   SpO2 99%   Visual Acuity Right Eye Distance:   Left Eye Distance:   Bilateral Distance:    Right Eye Near:   Left Eye Near:    Bilateral Near:     Physical Exam Exam conducted with a chaperone present.  Constitutional:      General: She is not in acute distress.    Appearance: Normal appearance.     Comments: Patient left the building before a complete physical exam was able to be completed.  HENT:     Head: Normocephalic and atraumatic.  Eyes:     Extraocular Movements: Extraocular movements intact.     Conjunctiva/sclera: Conjunctivae normal.  Pulmonary:     Effort: Pulmonary effort is normal.  Neurological:     General: No focal deficit present.     Mental Status: She is alert. She is disoriented.     Comments: Patient is alert and oriented to person, place, and time.  Although patient is not oriented to situation as she states " I am not sure what is going on."  Patient also going in and out of building after being told to stay in triage room.  Psychiatric:        Mood and Affect: Mood is anxious.        Behavior: Behavior is uncooperative and agitated.        Thought Content: Thought content is paranoid.     UC Treatments / Results  Labs (all labs ordered are listed, but only abnormal results are displayed) Labs Reviewed - No data to display  EKG   Radiology No results found.  Procedures Procedures (including critical care time)  Medications Ordered in UC Medications - No data to display  Initial Impression / Assessment and Plan / UC Course  I have reviewed the triage vital signs and the nursing notes.  Pertinent labs & imaging results that were available during my care of the patient were reviewed by me and considered in my medical decision making (see chart for details).     Explained to  patient there was concern  for loss of consciousness and head injury. Advised patient of the need to be evaluated further at the emergency department today.  EMS was called for patient.  Patient left the building multiple times after being told to stay in the triage room.  EMS arrived and patient left triage room again and was seen walking down the street with another person.  Explained to patient several times the need for further evaluation at the emergency department due to her clinical signs and symptoms.  Patient left AGAINST MEDICAL ADVICE. Final Clinical Impressions(s) / UC Diagnoses   Final diagnoses:  Syncope, unspecified syncope type  Right arm pain  Fall, initial encounter  Injury of head, initial encounter   Discharge Instructions   None    ED Prescriptions   None    PDMP not reviewed this encounter.   Lance Muss, FNP 01/26/21 1510

## 2021-01-26 NOTE — ED Notes (Signed)
Pt rating HA 6/10.

## 2021-01-26 NOTE — ED Triage Notes (Signed)
Pt reports LOC one month ago after falling off of a five foot wall.   States she felt dizzy and fell. States she has been experiencing black outs for the past month.  Pt c/o having a staph infection. States she broke her right arm this morning and c/o swelling and redness.   States she currently feels dizzy. Pt presents with minimal ROM to right arm.

## 2021-01-26 NOTE — ED Notes (Signed)
Pt called for triage, no answer. Pt not found outside front of UC or in parking lot.

## 2021-11-25 ENCOUNTER — Emergency Department (HOSPITAL_COMMUNITY): Payer: Self-pay

## 2021-11-25 ENCOUNTER — Other Ambulatory Visit: Payer: Self-pay

## 2021-11-25 ENCOUNTER — Inpatient Hospital Stay (HOSPITAL_COMMUNITY)
Admission: EM | Admit: 2021-11-25 | Discharge: 2021-11-28 | DRG: 897 | Disposition: A | Discharge status: Home / Self Care | Payer: Self-pay | Attending: Family Medicine | Admitting: Family Medicine

## 2021-11-25 DIAGNOSIS — F17210 Nicotine dependence, cigarettes, uncomplicated: Secondary | ICD-10-CM | POA: Diagnosis present

## 2021-11-25 DIAGNOSIS — R9431 Abnormal electrocardiogram [ECG] [EKG]: Secondary | ICD-10-CM

## 2021-11-25 DIAGNOSIS — Z91041 Radiographic dye allergy status: Secondary | ICD-10-CM

## 2021-11-25 DIAGNOSIS — F1123 Opioid dependence with withdrawal: Principal | ICD-10-CM | POA: Diagnosis present

## 2021-11-25 DIAGNOSIS — E876 Hypokalemia: Secondary | ICD-10-CM

## 2021-11-25 DIAGNOSIS — F1910 Other psychoactive substance abuse, uncomplicated: Secondary | ICD-10-CM

## 2021-11-25 DIAGNOSIS — N390 Urinary tract infection, site not specified: Secondary | ICD-10-CM

## 2021-11-25 DIAGNOSIS — R197 Diarrhea, unspecified: Secondary | ICD-10-CM | POA: Diagnosis present

## 2021-11-25 DIAGNOSIS — N12 Tubulo-interstitial nephritis, not specified as acute or chronic: Secondary | ICD-10-CM

## 2021-11-25 DIAGNOSIS — R6510 Systemic inflammatory response syndrome (SIRS) of non-infectious origin without acute organ dysfunction: Secondary | ICD-10-CM

## 2021-11-25 DIAGNOSIS — Z8249 Family history of ischemic heart disease and other diseases of the circulatory system: Secondary | ICD-10-CM

## 2021-11-25 DIAGNOSIS — Z881 Allergy status to other antibiotic agents status: Secondary | ICD-10-CM

## 2021-11-25 DIAGNOSIS — L03111 Cellulitis of right axilla: Secondary | ICD-10-CM

## 2021-11-25 DIAGNOSIS — F1193 Opioid use, unspecified with withdrawal: Secondary | ICD-10-CM | POA: Diagnosis present

## 2021-11-25 DIAGNOSIS — R112 Nausea with vomiting, unspecified: Secondary | ICD-10-CM | POA: Diagnosis present

## 2021-11-25 DIAGNOSIS — Z882 Allergy status to sulfonamides status: Secondary | ICD-10-CM

## 2021-11-25 DIAGNOSIS — R4182 Altered mental status, unspecified: Principal | ICD-10-CM

## 2021-11-25 DIAGNOSIS — K047 Periapical abscess without sinus: Secondary | ICD-10-CM | POA: Diagnosis present

## 2021-11-25 DIAGNOSIS — I4581 Long QT syndrome: Secondary | ICD-10-CM | POA: Diagnosis present

## 2021-11-25 DIAGNOSIS — Z79899 Other long term (current) drug therapy: Secondary | ICD-10-CM

## 2021-11-25 LAB — URINALYSIS, ROUTINE W REFLEX MICROSCOPIC
Bilirubin Urine: NEGATIVE
Glucose, UA: NEGATIVE mg/dL
Ketones, ur: 20 mg/dL — AB
Nitrite: POSITIVE — AB
Protein, ur: 30 mg/dL — AB
Specific Gravity, Urine: 1.028 (ref 1.005–1.030)
pH: 5 (ref 5.0–8.0)

## 2021-11-25 LAB — COMPREHENSIVE METABOLIC PANEL
ALT: 46 U/L — ABNORMAL HIGH (ref 0–44)
AST: 35 U/L (ref 15–41)
Albumin: 4.1 g/dL (ref 3.5–5.0)
Alkaline Phosphatase: 87 U/L (ref 38–126)
Anion gap: 13 (ref 5–15)
BUN: 13 mg/dL (ref 6–20)
CO2: 21 mmol/L — ABNORMAL LOW (ref 22–32)
Calcium: 9.2 mg/dL (ref 8.9–10.3)
Chloride: 104 mmol/L (ref 98–111)
Creatinine, Ser: 0.63 mg/dL (ref 0.44–1.00)
GFR, Estimated: >60 mL/min (ref >60)
Glucose, Bld: 152 mg/dL — ABNORMAL HIGH (ref 70–99)
Potassium: 3 mmol/L — ABNORMAL LOW (ref 3.5–5.1)
Sodium: 138 mmol/L (ref 135–145)
Total Bilirubin: 0.8 mg/dL (ref 0.3–1.2)
Total Protein: 9.1 g/dL — ABNORMAL HIGH (ref 6.5–8.1)

## 2021-11-25 LAB — CBC WITH DIFFERENTIAL/PLATELET
Abs Immature Granulocytes: 0.06 10*3/uL (ref 0.00–0.07)
Basophils Absolute: 0 10*3/uL (ref 0.0–0.1)
Basophils Relative: 0 %
Eosinophils Absolute: 0 10*3/uL (ref 0.0–0.5)
Eosinophils Relative: 0 %
HCT: 49.6 % — ABNORMAL HIGH (ref 36.0–46.0)
Hemoglobin: 17 g/dL — ABNORMAL HIGH (ref 12.0–15.0)
Immature Granulocytes: 1 %
Lymphocytes Relative: 9 %
Lymphs Abs: 1.1 10*3/uL (ref 0.7–4.0)
MCH: 29.7 pg (ref 26.0–34.0)
MCHC: 34.3 g/dL (ref 30.0–36.0)
MCV: 86.6 fL (ref 80.0–100.0)
Monocytes Absolute: 0.5 10*3/uL (ref 0.1–1.0)
Monocytes Relative: 4 %
Neutro Abs: 10.3 10*3/uL — ABNORMAL HIGH (ref 1.7–7.7)
Neutrophils Relative %: 86 %
Platelets: 489 10*3/uL — ABNORMAL HIGH (ref 150–400)
RBC: 5.73 MIL/uL — ABNORMAL HIGH (ref 3.87–5.11)
RDW: 12.9 % (ref 11.5–15.5)
WBC: 12 10*3/uL — ABNORMAL HIGH (ref 4.0–10.5)
nRBC: 0 % (ref 0.0–0.2)

## 2021-11-25 LAB — RENAL FUNCTION PANEL
Albumin: 3.2 g/dL — ABNORMAL LOW (ref 3.5–5.0)
Anion gap: 8 (ref 5–15)
BUN: 9 mg/dL (ref 6–20)
CO2: 21 mmol/L — ABNORMAL LOW (ref 22–32)
Calcium: 7.8 mg/dL — ABNORMAL LOW (ref 8.9–10.3)
Chloride: 109 mmol/L (ref 98–111)
Creatinine, Ser: 0.62 mg/dL (ref 0.44–1.00)
GFR, Estimated: >60 mL/min (ref >60)
Glucose, Bld: 124 mg/dL — ABNORMAL HIGH (ref 70–99)
Phosphorus: 3.2 mg/dL (ref 2.5–4.6)
Potassium: 3.5 mmol/L (ref 3.5–5.1)
Sodium: 138 mmol/L (ref 135–145)

## 2021-11-25 LAB — LACTIC ACID, PLASMA
Lactic Acid, Venous: 1.9 mmol/L (ref 0.5–1.9)
Lactic Acid, Venous: 1.1 mmol/L (ref 0.5–1.9)

## 2021-11-25 LAB — MRSA NEXT GEN BY PCR, NASAL: MRSA by PCR Next Gen: DETECTED — AB

## 2021-11-25 LAB — PREGNANCY, URINE: Preg Test, Ur: NEGATIVE

## 2021-11-25 LAB — RAPID URINE DRUG SCREEN, HOSP PERFORMED
Amphetamines: POSITIVE — AB
Barbiturates: NOT DETECTED
Benzodiazepines: NOT DETECTED
Cocaine: POSITIVE — AB
Opiates: NOT DETECTED
Tetrahydrocannabinol: NOT DETECTED

## 2021-11-25 LAB — SALICYLATE LEVEL: Salicylate Lvl: <7 mg/dL — ABNORMAL LOW (ref 7.0–30.0)

## 2021-11-25 LAB — HEMOGLOBIN A1C
Hgb A1c MFr Bld: 5.8 % — ABNORMAL HIGH (ref 4.8–5.6)
Mean Plasma Glucose: 119.76 mg/dL

## 2021-11-25 LAB — MAGNESIUM: Magnesium: 2.2 mg/dL (ref 1.7–2.4)

## 2021-11-25 LAB — CBG MONITORING, ED: Glucose-Capillary: 165 mg/dL — ABNORMAL HIGH (ref 70–99)

## 2021-11-25 LAB — ETHANOL: Alcohol, Ethyl (B): <10 mg/dL (ref <10)

## 2021-11-25 LAB — ACETAMINOPHEN LEVEL: Acetaminophen (Tylenol), Serum: <10 ug/mL — ABNORMAL LOW (ref 10–30)

## 2021-11-25 LAB — LIPASE, BLOOD: Lipase: 26 U/L (ref 11–51)

## 2021-11-25 MED ORDER — ACETAMINOPHEN 325 MG PO TABS
650.0000 mg | ORAL_TABLET | Freq: Four times a day (QID) | ORAL | Status: DC | PRN
  Administered 2021-11-26: 650 mg via ORAL
  Filled 2021-11-25: qty 2

## 2021-11-25 MED ORDER — VANCOMYCIN HCL IN DEXTROSE 1-5 GM/200ML-% IV SOLN: 1000.0000 mg | INTRAVENOUS | Status: DC

## 2021-11-25 MED ORDER — SODIUM CHLORIDE 0.9 % IV SOLN
1.0000 g | Freq: Once | INTRAVENOUS | Status: AC
  Administered 2021-11-25: 1 g via INTRAVENOUS
  Filled 2021-11-25: qty 10

## 2021-11-25 MED ORDER — HYDROXYZINE HCL 25 MG PO TABS
25.0000 mg | ORAL_TABLET | Freq: Four times a day (QID) | ORAL | Status: DC | PRN
  Administered 2021-11-27 (×2): 25 mg via ORAL
  Filled 2021-11-25 (×2): qty 1

## 2021-11-25 MED ORDER — LORAZEPAM 2 MG/ML IJ SOLN
0.5000 mg | Freq: Four times a day (QID) | INTRAMUSCULAR | Status: DC | PRN
  Administered 2021-11-25 – 2021-11-28 (×3): 0.5 mg via INTRAVENOUS
  Filled 2021-11-25 (×3): qty 1

## 2021-11-25 MED ORDER — CHLORHEXIDINE GLUCONATE CLOTH 2 % EX PADS
6.0000 | MEDICATED_PAD | Freq: Once | CUTANEOUS | Status: AC
  Administered 2021-11-25: 6 via TOPICAL

## 2021-11-25 MED ORDER — POTASSIUM CHLORIDE 10 MEQ/100ML IV SOLN
10.0000 meq | INTRAVENOUS | Status: AC
  Administered 2021-11-25 (×4): 10 meq via INTRAVENOUS
  Filled 2021-11-25 (×4): qty 100

## 2021-11-25 MED ORDER — POTASSIUM CHLORIDE 10 MEQ/100ML IV SOLN
10.0000 meq | INTRAVENOUS | Status: AC
  Administered 2021-11-25 (×2): 10 meq via INTRAVENOUS
  Filled 2021-11-25 (×2): qty 100

## 2021-11-25 MED ORDER — VANCOMYCIN HCL IN DEXTROSE 1-5 GM/200ML-% IV SOLN
1000.0000 mg | Freq: Once | INTRAVENOUS | Status: AC
  Administered 2021-11-25: 1000 mg via INTRAVENOUS
  Filled 2021-11-25: qty 200

## 2021-11-25 MED ORDER — METHOCARBAMOL 500 MG PO TABS
500.0000 mg | ORAL_TABLET | Freq: Three times a day (TID) | ORAL | Status: DC | PRN
  Administered 2021-11-27 – 2021-11-28 (×3): 500 mg via ORAL
  Filled 2021-11-25 (×3): qty 1

## 2021-11-25 MED ORDER — CLONIDINE HCL 0.1 MG PO TABS: 0.1000 mg | ORAL_TABLET | ORAL | Status: DC

## 2021-11-25 MED ORDER — SODIUM CHLORIDE 0.9 % IV SOLN: INTRAVENOUS | Status: DC

## 2021-11-25 MED ORDER — MUPIROCIN 2 % EX OINT
1.0000 "application " | TOPICAL_OINTMENT | Freq: Two times a day (BID) | CUTANEOUS | Status: DC
  Administered 2021-11-25 – 2021-11-27 (×5): 1 via NASAL
  Filled 2021-11-25: qty 22

## 2021-11-25 MED ORDER — CLONIDINE HCL 0.1 MG PO TABS: 0.1000 mg | ORAL_TABLET | Freq: Every day | ORAL | Status: DC

## 2021-11-25 MED ORDER — NAPROXEN 250 MG PO TABS
500.0000 mg | ORAL_TABLET | Freq: Two times a day (BID) | ORAL | Status: DC | PRN
  Administered 2021-11-26 – 2021-11-27 (×3): 500 mg via ORAL
  Filled 2021-11-25 (×4): qty 2

## 2021-11-25 MED ORDER — CLONIDINE HCL 0.1 MG PO TABS
0.1000 mg | ORAL_TABLET | Freq: Four times a day (QID) | ORAL | Status: DC
  Administered 2021-11-25: 0.1 mg via ORAL
  Filled 2021-11-25 (×2): qty 1

## 2021-11-25 MED ORDER — PROCHLORPERAZINE EDISYLATE 10 MG/2ML IJ SOLN
10.0000 mg | Freq: Once | INTRAMUSCULAR | Status: AC
  Administered 2021-11-25: 10 mg via INTRAVENOUS
  Filled 2021-11-25: qty 2

## 2021-11-25 MED ORDER — ENOXAPARIN SODIUM 40 MG/0.4ML IJ SOSY
40.0000 mg | PREFILLED_SYRINGE | INTRAMUSCULAR | Status: DC
  Administered 2021-11-25 – 2021-11-27 (×3): 40 mg via SUBCUTANEOUS
  Filled 2021-11-25 (×3): qty 0.4

## 2021-11-25 MED ORDER — SODIUM CHLORIDE 0.9 % IV SOLN
1.0000 g | INTRAVENOUS | Status: DC
  Administered 2021-11-26 – 2021-11-27 (×2): 1 g via INTRAVENOUS
  Filled 2021-11-25 (×2): qty 10

## 2021-11-25 MED ORDER — ACETAMINOPHEN 650 MG RE SUPP: 650.0000 mg | Freq: Four times a day (QID) | RECTAL | Status: DC | PRN

## 2021-11-25 MED ORDER — MAGNESIUM SULFATE 2 GM/50ML IV SOLN
2.0000 g | Freq: Once | INTRAVENOUS | Status: AC
  Administered 2021-11-25: 2 g via INTRAVENOUS
  Filled 2021-11-25: qty 50

## 2021-11-25 MED ORDER — DICYCLOMINE HCL 20 MG PO TABS
20.0000 mg | ORAL_TABLET | Freq: Four times a day (QID) | ORAL | Status: DC | PRN
  Filled 2021-11-25: qty 1

## 2021-11-25 MED ORDER — LOPERAMIDE HCL 2 MG PO CAPS
2.0000 mg | ORAL_CAPSULE | ORAL | Status: DC | PRN
  Administered 2021-11-26: 4 mg via ORAL
  Administered 2021-11-26: 2 mg via ORAL
  Filled 2021-11-25: qty 2
  Filled 2021-11-25: qty 1

## 2021-11-25 MED ORDER — SODIUM CHLORIDE 0.9 % IV BOLUS
1000.0000 mL | Freq: Once | INTRAVENOUS | Status: AC
  Administered 2021-11-25: 1000 mL via INTRAVENOUS

## 2021-11-25 MED ORDER — CHLORHEXIDINE GLUCONATE CLOTH 2 % EX PADS
6.0000 | MEDICATED_PAD | Freq: Every day | CUTANEOUS | Status: DC
  Administered 2021-11-26: 6 via TOPICAL

## 2021-11-25 NOTE — ED Triage Notes (Signed)
BIBA ?Per EMS: Pt comes from motel w/ c/o heroin withdrawal. Pt withdrawing from heroin and meth. Pt was found semi-responsive.  Last use 36 hours ago.  ? ?Upon changing pt into gown, crack pipe fell onto ground and broke. Disposed of pipe in sharps. Pt asking for pipe back.  ?

## 2021-11-25 NOTE — Progress Notes (Signed)
A consult was received from an ED physician for vancomcyin per pharmacy dosing.  The patient's profile has been reviewed for ht/wt/allergies/indication/available labs.   ?A one time order has been placed for Vancomycin 1g.  Further antibiotics/pharmacy consults should be ordered by admitting physician if indicated.       ?                ?Thank you, ? ?Lynann Beaver PharmD, BCPS ?Clinical Pharmacist ?[facility] [phone] ?11/25/2021 1:56 PM ? ? ?

## 2021-11-25 NOTE — ED Notes (Signed)
Unable to get temp, due to pt biting on the probe  ?

## 2021-11-25 NOTE — H&P (Signed)
?History and Physical  ? ? ?Patient: [name] NKN:[id number] DOB: 1981/03/17 ?DOA: 11/25/2021 ?DOS: the patient was seen and examined on 11/25/2021 ?PCP: Patient, No Pcp Per (Inactive)  ?Patient coming from:  [facility] ? ?Chief Complaint:  ?Chief Complaint  ?Patient presents with  ? Withdrawal  ? ?HPI: [name] is a 41 y.o. female with medical history significant of polysubstance abuse, prolonged QT syndrome. Presenting with N/V/D. She reports that she's in withdrawal from heroin. She has had symptoms for the last 2 days. She notes that it's been over 2 days since her last hit of heroin. She has had N/V, body aches. She denies fevers. She denies sick contacts. She was found by EMS. She was altered but maintaining her airway. She denies any other aggravating or alleviating factors.  ?  ?Review of Systems: As mentioned in the history of present illness. All other systems reviewed and are negative. ?Past Medical History:  ?Diagnosis Date  ? Kidney contusion   ? Long Q-T syndrome   ? PMDD (premenstrual dysphoric disorder)   ? Polysubstance abuse (HCC)   ? a. 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  ? 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 Has not started medicine, reports 10/2010 xray was clear. ? Needs INH.  ? Rib fractures   ? Tobacco abuse   ? ?No past surgical history on file. ?Social History:  reports that she has been smoking cigarettes. She has been smoking an average of 2 packs per day. She has never used smokeless tobacco. She reports current alcohol use. She reports that she does not use drugs. ? ?Allergies  ?Allergen Reactions  ? Iodinated Contrast Media Shortness Of Breath and Palpitations  ?  CTA chest PE done 08/2014. Pt had SOB, chest heaviness, near syncope after contrast was administered. (Pt is unable to have Epinephrine due to cardiac issue)  ? Clindamycin/Lincomycin   ?  palpitations  ? Other Other (See Comments)  ?  QT Prolonging Drugs  ? Sulfa Antibiotics Nausea Only  ? ? ?Family  History  ?Problem Relation Age of Onset  ? Healthy Mother   ? Healthy Father   ? CAD Other   ?     Grandmother's father  ? Other Other   ?     Mother was worked up remotely for ?parasympathetic nervous system problem but cardiac workup was reportedly unremarkable  ? ? ?Prior to Admission medications   ?Medication Sig Start Date End Date Taking? Authorizing Provider  ?UNKNOWN TO PATIENT BCPs    [provider]  ?DULoxetine (CYMBALTA) 20 MG capsule Take 20 mg by mouth daily.  10/27/19  [provider]  ?gabapentin (NEURONTIN) 300 MG capsule Take 1 capsule (300 mg total) by mouth 2 (two) times daily. 05/18/19 10/27/19  Elvina Sidle, MD  ? ? ?Physical Exam: ?Vitals:  ? 11/25/21 1330 11/25/21 1345 11/25/21 1359 11/25/21 1400  ?BP: 123/87 133/73    ?Pulse: 62 61  61  ?Resp: (!) 31 (!) 31  17  ?Temp:      ?TempSrc:      ?SpO2: 100% 100%  100%  ?Weight:   49.9 kg   ?Height:   4\' 11"  (1.499 m)   ? ?General: 41 y.o. female resting in bed in NAD ?Eyes: PERRL, normal sclera ?ENMT: Nares patent w/o discharge, orophaynx clear, dentition poor, ears w/o discharge/lesions/ulcers ?Neck: Supple, trachea midline ?Cardiovascular: brady, +S1, S2, no m/g/r, equal pulses throughout ?Respiratory: CTABL, no w/r/r, normal WOB ?GI: BS+, NDNT, no masses  noted, no organomegaly noted ?MSK: No e/c/c; track marks noted; erythema right axila ?Neuro: A&O x 3, no focal deficits ?Psyc: Flat affect, calm/cooperative ? ?Data Reviewed: ? ?K+  3.0 ?Glucose  152 ?Lactic acid 1.9 ?WBC 12 ?Hgb 17 ?Plt 489 ?UA positive ?UDS: amphetamine, cocaine positive ? ?CXR: No active disease. ? ?Assessment and Plan: ?No notes have been filed under this hospital service. ?Service: Hospitalist ?Polysubstance abuse ?Heroin withdrawal ?    - admit to inpt, SDU ?    - clonidine detox protocol ?    - fluids ?    - will use ativan for anti-emetic as she has extremely long QT; see below ?    - counsel against further use ? ?UTI ?Right axilla erythema; possible  cellulitis ?SIRS ?    - started on rocephin for UTI ?    - vanc started for concern for possible cellulitis  ?    - can continue both for now ?    - follow Bld Cx, UCx ? ?Hypokalemia ?    - replace K+; Mg2+ is ok ? ?Prolonged QT syndrome ?    - watch prolonging meds ?    - keep on tele ?    - use ativan for anti-emetic ?    - replace K+; Mg2+ is good ? ?Advance Care Planning:   Code Status: FULL ? ?Consults: None ? ?Family Communication: None at bedside ? ?Severity of Illness: ?The appropriate patient status for this patient is INPATIENT. Inpatient status is judged to be reasonable and necessary in order to provide the required intensity of service to ensure the patient's safety. The patient's presenting symptoms, physical exam findings, and initial radiographic and laboratory data in the context of their chronic comorbidities is felt to place them at high risk for further clinical deterioration. Furthermore, it is not anticipated that the patient will be medically stable for discharge from the hospital within 2 midnights of admission.  ? ?* I certify that at the point of admission it is my clinical judgment that the patient will require inpatient hospital care spanning beyond 2 midnights from the point of admission due to high intensity of service, high risk for further deterioration and high frequency of surveillance required.* ? ?Author: ?Teddy Spike, DO ?11/25/2021 2:16 PM ? ?For on call review [email or web address].  ?

## 2021-11-25 NOTE — Progress Notes (Signed)
Pharmacy Antibiotic Note ? ?[name] is a 41 y.o. female admitted on 11/25/2021 with  sepsis/cellulits .  Pharmacy has been consulted for Vanco dosing. ? 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 ?ID: Sepis + Cellulitis R axnilla from drug use. LA 1.9, no PC on admit. ?- Tmax 100.1. WBC 12., Scr <1 ? ?Vanco 5/11>> ? ?Plan: ?Vanco 1g IV x 1 ordered in ED ?Vancomycin 1000 mg IV Q 24 hrs. Goal AUC 400-550. ?Expected AUC: 481 ?SCr used: 0.8 ? ? ? ?Height: 4\' 11"  (149.9 cm) ?Weight: 49.9 kg (110 lb) ?IBW/kg (Calculated) : 43.2 ? ?Temp (24hrs), Avg:99.6 ?F (37.6 ?C), Min:99.1 ?F (37.3 ?C), Max:100.1 ?F (37.8 ?C) ? ?Recent Labs  ?Lab 11/25/21 ?1147 11/25/21 ?1148  ?WBC 12.0*  --   ?CREATININE 0.63  --   ?LATICACIDVEN  --  1.9  ?  ?Estimated Creatinine Clearance: 64.4 mL/min (by C-G formula based on SCr of 0.63 mg/dL).   ? ?Allergies  ?Allergen Reactions  ? Iodinated Contrast Media Shortness Of Breath and Palpitations  ?  CTA chest PE done 08/2014. Pt had SOB, chest heaviness, near syncope after contrast was administered. (Pt is unable to have Epinephrine due to cardiac issue)  ? Clindamycin/Lincomycin   ?  palpitations  ? Other Other (See Comments)  ?  QT Prolonging Drugs  ? Sulfa Antibiotics Nausea Only  ? ? ?Antimicrobials this admission: ?[name] S. 09/2014, PharmD, BCPS ?Clinical Staff Pharmacist ?[email or web address] ? ?[name], [name] ?11/25/2021 3:01 PM ? ?

## 2021-11-25 NOTE — ED Notes (Signed)
Pt resting comfortably

## 2021-11-25 NOTE — ED Provider Notes (Signed)
?[facility]-EMERGENCY DEPT ?Provider Note ? ? ?CSN: [id number] ?Arrival date & time: 11/25/21  1036 ? ?  ? ?History ? ?Chief Complaint  ?Patient presents with  ? Withdrawal  ? ? ?[name] is a 41 y.o. female. ? ?Level 5 caveat for altered mental status.  Patient brought in by EMS with "heroin withdrawal".  Patient states that she has had pain all over and nausea vomiting and diarrhea for the past 24 to 48 hours.  States she last used heroin about 36 hours ago.  EMS reports her to be "semi responsive" and confused.  Was found to have cracked bite on her on arrival.  No known fever.  States she has pain all over.  Has had multiple episodes of vomiting and diarrhea.  No chest pain or shortness of breath.  Also admits to alcohol use ? ?The history is provided by the patient and the EMS personnel. The history is limited by the condition of the patient.  ? ?  ? ?Home Medications ?Prior to Admission medications   ?Medication Sig Start Date End Date Taking? Authorizing Provider  ?HYDROcodone-acetaminophen (NORCO/VICODIN) 5-325 MG tablet Take 1-2 tablets by mouth every 6 (six) hours as needed. 10/27/19   Eustace Moore, MD  ?ibuprofen (ADVIL) 800 MG tablet Take 1 tablet (800 mg total) by mouth 3 (three) times daily. 03/22/19   Georgetta Haber, NP  ?tiZANidine (ZANAFLEX) 4 MG tablet Take 1-2 tablets (4-8 mg total) by mouth every 6 (six) hours as needed for muscle spasms. 10/27/19   Eustace Moore, MD  ?Crissie Figures TO PATIENT BCPs    [provider]  ?DULoxetine (CYMBALTA) 20 MG capsule Take 20 mg by mouth daily.  10/27/19  [provider]  ?gabapentin (NEURONTIN) 300 MG capsule Take 1 capsule (300 mg total) by mouth 2 (two) times daily. 05/18/19 10/27/19  Elvina Sidle, MD  ?   ? ?Allergies    ?Iodinated contrast media, Clindamycin/lincomycin, Other, and Sulfa antibiotics   ? ?Review of Systems   ?Review of Systems  ?Unable to perform ROS: Mental status change  ? ?Physical  Exam ?Updated Vital Signs ?BP 122/75 (BP Location: Right Arm)   Pulse (!) 59   Temp 99.1 ?F (37.3 ?C) (Axillary)   Resp (!) 31   SpO2 100%  ?Physical Exam ?Vitals and nursing note reviewed.  ?Constitutional:   ?   General: She is not in acute distress. ?   Appearance: She is well-developed.  ?   Comments: Disheveled, obtunded, oriented to person only  ?HENT:  ?   Head: Normocephalic and atraumatic.  ?   Mouth/Throat:  ?   Pharynx: No oropharyngeal exudate.  ?Eyes:  ?   Conjunctiva/sclera: Conjunctivae normal.  ?   Pupils: Pupils are equal, round, and reactive to light.  ?Neck:  ?   Comments: No meningismus. ?Cardiovascular:  ?   Rate and Rhythm: Normal rate and regular rhythm.  ?   Heart sounds: Normal heart sounds. No murmur heard. ?Pulmonary:  ?   Effort: Pulmonary effort is normal. No respiratory distress.  ?   Breath sounds: Normal breath sounds.  ?   Comments: Tachypnea, clear lungs ?Abdominal:  ?   Palpations: Abdomen is soft.  ?   Tenderness: There is no abdominal tenderness. There is no guarding or rebound.  ?Musculoskeletal:     ?   General: No tenderness. Normal range of motion.  ?   Cervical back: Normal range of motion and neck supple.  ?Skin: ?  General: Skin is warm.  ?   Comments: Track marks and excoriations throughout body.  Area of erythema to right axilla without fluctuance or drainage  ?Neurological:  ?   General: No focal deficit present.  ?   Mental Status: She is alert and oriented to person, place, and time. Mental status is at baseline.  ?   Cranial Nerves: No cranial nerve deficit.  ?   Motor: No abnormal muscle tone.  ?   Coordination: Coordination normal.  ?   Comments:  5/5 strength throughout. CN 2-12 intact.Equal grip strength.   ?Psychiatric:     ?   Behavior: Behavior normal.  ? ? ?ED Results / Procedures / Treatments   ?Labs ?(all labs ordered are listed, but only abnormal results are displayed) ?Labs Reviewed  ?CBC WITH DIFFERENTIAL/PLATELET - Abnormal; Notable for the  following components:  ?    Result Value  ? WBC 12.0 (*)   ? RBC 5.73 (*)   ? Hemoglobin 17.0 (*)   ? HCT 49.6 (*)   ? Platelets 489 (*)   ? Neutro Abs 10.3 (*)   ? All other components within normal limits  ?COMPREHENSIVE METABOLIC PANEL - Abnormal; Notable for the following components:  ? Potassium 3.0 (*)   ? CO2 21 (*)   ? Glucose, Bld 152 (*)   ? Total Protein 9.1 (*)   ? ALT 46 (*)   ? All other components within normal limits  ?URINALYSIS, ROUTINE W REFLEX MICROSCOPIC - Abnormal; Notable for the following components:  ? Color, Urine AMBER (*)   ? APPearance HAZY (*)   ? Hgb urine dipstick SMALL (*)   ? Ketones, ur 20 (*)   ? Protein, ur 30 (*)   ? Nitrite POSITIVE (*)   ? Leukocytes,Ua MODERATE (*)   ? Bacteria, UA MANY (*)   ? All other components within normal limits  ?RAPID URINE DRUG SCREEN, HOSP PERFORMED - Abnormal; Notable for the following components:  ? Cocaine POSITIVE (*)   ? Amphetamines POSITIVE (*)   ? All other components within normal limits  ?ACETAMINOPHEN LEVEL - Abnormal; Notable for the following components:  ? Acetaminophen (Tylenol), Serum <10 (*)   ? All other components within normal limits  ?SALICYLATE LEVEL - Abnormal; Notable for the following components:  ? Salicylate Lvl <7.0 (*)   ? All other components within normal limits  ?CULTURE, BLOOD (ROUTINE X 2)  ?CULTURE, BLOOD (ROUTINE X 2)  ?MAGNESIUM  ?LACTIC ACID, PLASMA  ?LIPASE, BLOOD  ?ETHANOL  ?PREGNANCY, URINE  ?LACTIC ACID, PLASMA  ?CBG MONITORING, ED  ? ? ?EKG ?EKG Interpretation ? ?Date/Time:  Thursday Nov 25 2021 11:26:19 EDT ?Ventricular Rate:  61 ?PR Interval:  92 ?QRS Duration: 106 ?QT Interval:  443 ?QTC Calculation: 447 ?R Axis:   107 ?Text Interpretation: Sinus or ectopic atrial rhythm Short PR interval LVH with secondary repolarization abnormality Probable lateral infarct, age indeterminate Anterior infarct, old Interpretation limited secondary to artifact Confirmed by Glynn Octave [phone]) on 11/25/2021 11:40:37  AM ? ?Radiology ?No results found. ? ?Procedures ?Marland KitchenCritical Care ?Performed by: Glynn Octave, MD ?Authorized by: Glynn Octave, MD  ? ?Critical care provider statement:  ?  Critical care time (minutes):  35 ?  Critical care time was exclusive of:  Separately billable procedures and treating other patients ?  Critical care was necessary to treat or prevent imminent or life-threatening deterioration of the following conditions:  Metabolic crisis (prolonged QT) ?  Critical care was time  spent personally by me on the following activities:  Development of treatment plan with patient or surrogate, discussions with consultants, evaluation of patient's response to treatment, examination of patient, ordering and review of laboratory studies, ordering and review of radiographic studies, ordering and performing treatments and interventions, pulse oximetry, re-evaluation of patient's condition and review of old charts ?  I assumed direction of critical care for this patient from another provider in my specialty: yes   ?  Care discussed with: admitting provider    ? ? ?Medications Ordered in ED ?Medications - No data to display ? ?ED Course/ Medical Decision Making/ A&P ?  ?                        ?Medical Decision Making ?Amount and/or Complexity of Data Reviewed ?Labs: ordered. ?Radiology: ordered. ?ECG/medicine tests: ordered. ? ?Risk ?Prescription drug management. ?Decision regarding hospitalization. ? ?Patient with history of substance abuse here with concern for withdrawal.  She is altered and unable to give much of a history.  She is tachypneic but not tachycardic and not febrile.  CBG was 211 ? ?EKG is obtained and shows sinus rhythm with PACs as well as prolonged QT interval of 612.  Will avoid Zofran and other QT prolonging agents. ? ?Labs are reassuring and show hemoconcentration, hypokalemia.  UTI noted.  Cultures will be sent. ? ?Concern for early sepsis and pyelonephritis with borderline temperature,  tachypnea and altered mental status. ? ?Spectrum antibiotics are initiated after cultures are obtained. ?Given patient's somnolence, UTI, prolonged QT, will plan admission ?Potassium to be replaced. Mag is normal. CT head nona

## 2021-11-26 LAB — COMPREHENSIVE METABOLIC PANEL
ALT: 37 U/L (ref 0–44)
AST: 28 U/L (ref 15–41)
Albumin: 3.2 g/dL — ABNORMAL LOW (ref 3.5–5.0)
Alkaline Phosphatase: 63 U/L (ref 38–126)
Anion gap: 9 (ref 5–15)
BUN: 7 mg/dL (ref 6–20)
CO2: 18 mmol/L — ABNORMAL LOW (ref 22–32)
Calcium: 7.6 mg/dL — ABNORMAL LOW (ref 8.9–10.3)
Chloride: 104 mmol/L (ref 98–111)
Creatinine, Ser: 0.47 mg/dL (ref 0.44–1.00)
GFR, Estimated: >60 mL/min (ref >60)
Glucose, Bld: 139 mg/dL — ABNORMAL HIGH (ref 70–99)
Potassium: 3.5 mmol/L (ref 3.5–5.1)
Sodium: 131 mmol/L — ABNORMAL LOW (ref 135–145)
Total Bilirubin: 0.7 mg/dL (ref 0.3–1.2)
Total Protein: 6.8 g/dL (ref 6.5–8.1)

## 2021-11-26 LAB — CBC
HCT: 41.5 % (ref 36.0–46.0)
Hemoglobin: 14.1 g/dL (ref 12.0–15.0)
MCH: 30 pg (ref 26.0–34.0)
MCHC: 34 g/dL (ref 30.0–36.0)
MCV: 88.3 fL (ref 80.0–100.0)
Platelets: 380 10*3/uL (ref 150–400)
RBC: 4.7 MIL/uL (ref 3.87–5.11)
RDW: 13 % (ref 11.5–15.5)
WBC: 12.3 10*3/uL — ABNORMAL HIGH (ref 4.0–10.5)
nRBC: 0 % (ref 0.0–0.2)

## 2021-11-26 LAB — HIV ANTIBODY (ROUTINE TESTING W REFLEX): HIV Screen 4th Generation wRfx: NONREACTIVE

## 2021-11-26 MED ORDER — POTASSIUM CHLORIDE CRYS ER 20 MEQ PO TBCR
40.0000 meq | EXTENDED_RELEASE_TABLET | Freq: Once | ORAL | Status: AC
  Administered 2021-11-26: 40 meq via ORAL
  Filled 2021-11-26: qty 2

## 2021-11-26 MED ORDER — VANCOMYCIN HCL 500 MG/100ML IV SOLN
500.0000 mg | Freq: Two times a day (BID) | INTRAVENOUS | Status: DC
  Administered 2021-11-26 – 2021-11-27 (×3): 500 mg via INTRAVENOUS
  Filled 2021-11-26 (×3): qty 100

## 2021-11-26 MED ORDER — LOPERAMIDE HCL 2 MG PO CAPS
2.0000 mg | ORAL_CAPSULE | Freq: Three times a day (TID) | ORAL | Status: DC
  Administered 2021-11-26 – 2021-11-28 (×4): 2 mg via ORAL
  Filled 2021-11-26 (×4): qty 1
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
## 2021-11-26 NOTE — Progress Notes (Addendum)
Pharmacy Antibiotic Note ? ?[name] is a 41 y.o. female admitted on 11/25/2021 with  sepsis/cellulits .  Pharmacy has been consulted for Vanco dosing. 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 ?ID: Sepis + Cellulitis R axilla from drug use.  ?AF, Tm 99.3, WBC 12.3, SCr 0.47 ? ?Plan: ?Vancomycin 500 mg IV Q 12 hrs. Goal AUC 400-550. ?Expected AUC: 502, Css min 14.9 ?SCr used: 0.8 ?Ceftriaxone 1 gm q24 for UTI ?F/u for de-escalation to oral abx ? ?Height: 4\' 11"  (149.9 cm) ?Weight: 47.8 kg (105 lb 6.1 oz) ?IBW/kg (Calculated) : 43.2 ? ?Temp (24hrs), Avg:98.9 ?F (37.2 ?C), Min:97.7 ?F (36.5 ?C), Max:99.3 ?F (37.4 ?C) ? ?Recent Labs  ?Lab 11/25/21 ?1147 11/25/21 ?1148 11/25/21 ?1348 11/25/21 ?1957 11/26/21 ?0237  ?WBC 12.0*  --   --   --  12.3*  ?CREATININE 0.63  --   --  0.62 0.47  ?LATICACIDVEN  --  1.9 1.1  --   --   ? ?  ?Estimated Creatinine Clearance: 64.4 mL/min (by C-G formula based on SCr of 0.47 mg/dL).   ? ?Allergies  ?Allergen Reactions  ? Iodinated Contrast Media Shortness Of Breath and Palpitations  ?  CTA chest PE done 08/2014. Pt had SOB, chest heaviness, near syncope after contrast was administered. (Pt is unable to have Epinephrine due to cardiac issue)  ? Clindamycin/Lincomycin   ?  palpitations  ? Other Other (See Comments)  ?  QT Prolonging Drugs  ? Sulfa Antibiotics Nausea Only  ? ?Vanco 5/11>> ?CTX 5/11>> ? ?5/11 MRSA + ?5/11 BCx2 ngtd ?5/12 HIV NR ? ? ?7/12, Pharm.D ?11/26/2021 11:50 AM ? ?

## 2021-11-27 LAB — COMPREHENSIVE METABOLIC PANEL
ALT: 40 U/L (ref 0–44)
AST: 34 U/L (ref 15–41)
Albumin: 3.5 g/dL (ref 3.5–5.0)
Alkaline Phosphatase: 61 U/L (ref 38–126)
Anion gap: 9 (ref 5–15)
BUN: 8 mg/dL (ref 6–20)
CO2: 23 mmol/L (ref 22–32)
Calcium: 8.4 mg/dL — ABNORMAL LOW (ref 8.9–10.3)
Chloride: 103 mmol/L (ref 98–111)
Creatinine, Ser: 0.6 mg/dL (ref 0.44–1.00)
GFR, Estimated: >60 mL/min (ref >60)
Glucose, Bld: 146 mg/dL — ABNORMAL HIGH (ref 70–99)
Potassium: 3.7 mmol/L (ref 3.5–5.1)
Sodium: 135 mmol/L (ref 135–145)
Total Bilirubin: 0.7 mg/dL (ref 0.3–1.2)
Total Protein: 7.5 g/dL (ref 6.5–8.1)

## 2021-11-27 LAB — CBC
HCT: 44.3 % (ref 36.0–46.0)
Hemoglobin: 15 g/dL (ref 12.0–15.0)
MCH: 29.8 pg (ref 26.0–34.0)
MCHC: 33.9 g/dL (ref 30.0–36.0)
MCV: 88.1 fL (ref 80.0–100.0)
Platelets: 390 10*3/uL (ref 150–400)
RBC: 5.03 MIL/uL (ref 3.87–5.11)
RDW: 13 % (ref 11.5–15.5)
WBC: 10.7 10*3/uL — ABNORMAL HIGH (ref 4.0–10.5)
nRBC: 0 % (ref 0.0–0.2)

## 2021-11-27 LAB — MAGNESIUM: Magnesium: 1.9 mg/dL (ref 1.7–2.4)

## 2021-11-27 MED ORDER — AMOXICILLIN-POT CLAVULANATE 875-125 MG PO TABS
1.0000 | ORAL_TABLET | Freq: Two times a day (BID) | ORAL | Status: DC
  Administered 2021-11-27 – 2021-11-28 (×2): 1 via ORAL
  Filled 2021-11-27 (×2): qty 1
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
## 2021-11-28 ENCOUNTER — Encounter (HOSPITAL_COMMUNITY): Payer: Self-pay | Admitting: Internal Medicine

## 2021-11-28 MED ORDER — NICOTINE 14 MG/24HR TD PT24
14.0000 mg | MEDICATED_PATCH | Freq: Every day | TRANSDERMAL | 0 refills | Status: DC
  Filled 2021-11-28: qty 28, 28d supply, fill #0

## 2021-11-28 MED ORDER — NAPROXEN 500 MG PO TABS: 500.0000 mg | ORAL_TABLET | Freq: Two times a day (BID) | ORAL | 0 refills | Status: DC | PRN

## 2021-11-28 MED ORDER — NICOTINE 14 MG/24HR TD PT24
14.0000 mg | MEDICATED_PATCH | Freq: Every day | TRANSDERMAL | Status: DC
  Administered 2021-11-28: 14 mg via TRANSDERMAL
  Filled 2021-11-28: qty 1

## 2021-11-28 MED ORDER — ACETAMINOPHEN 325 MG PO TABS: 650.0000 mg | ORAL_TABLET | Freq: Four times a day (QID) | ORAL | Status: DC | PRN

## 2021-11-28 MED ORDER — AMOXICILLIN-POT CLAVULANATE 875-125 MG PO TABS: 1.0000 | ORAL_TABLET | Freq: Two times a day (BID) | ORAL | 0 refills | Status: DC
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
## 2021-11-28 NOTE — TOC Transition Note (Signed)
Transition of Care (TOC) - CM/SW Discharge Note ? ? ?Patient Details  ?Name: [name] ?MRN: [id number] ?Date of Birth: Feb 27, 1981 ? ?Transition of Care (TOC) CM/SW Contact:  ?Darleene Cleaver, LCSW ?Phone Number: ?11/28/2021, 12:25 PM ? ? ?Clinical Narrative:    ? ?CSW received consult for patient needing substance abuse resources and also homeless shelters.  CSW added information to her AVS.  Patient[place] the county, her antibiotic is going to be cheap at the [employer].  CSW can not assist any other way.  Follow up for [facility] and [facility] on AVS for her to schedule a hospital follow up. ? ?Final next level of care: Home/Self Care ?Barriers to Discharge: Barriers Resolved ? ? ?Patient Goals and CMS Choice ?  ?  ?  ? ?Discharge Placement ?  ?           ?  ?  ?  ?  ? ?Discharge Plan and Services ?  ?  ?           ?  ?  ?  ?  ?  ?  ?  ?  ?  ?  ? ?Social Determinants of Health (SDOH) Interventions ?  ? ? ?Readmission Risk Interventions ?   ? View : No data to display.  ?  ?  ?  ? ? ? ? ? ?

## 2021-11-28 NOTE — Progress Notes (Signed)
Discharged home per order. Discharge instructions reviewed with pt, voiced understanding. Copy of instructions given to pt. IV d/c. All personal belongings sent with pt. ?

## 2021-11-28 NOTE — Discharge Instructions (Addendum)
Outpatient Substance Use Treatment Services   [facility] Outpatient [facility] [address] [place], [place] [address]  [phone] Private insurance, Medicare A&B, and GCCN   [facility] ([facility])  [address]  [place], [place] [address] [phone] Medicaid, Self Pay   [facility]      [address]  [place], [place] [phone] Medicaid and Private Insurance, Self Pay   [facility] [address]  [place], [place]  [phone] Private Insurance, and Self Pay  [facility]      [address]    [place], [place] [address]  [phone] or [phone] Private Insurance Only   [facility] [address]  [place], [place] [address] [phone] Medicaid, Medicare, Private Insurance  [facility] [address]  [place], [place] [address] [phone] Services are free or reduced  [facility]  [address] [phone]  Self Pay only, sliding scale  [facility]  [address]  [place], [place] [address] [phone] ([facility]) Self Pay, Medicaid Only   [facility] [address]  [place], [place] [address] [phone] Medicaid, Medicare, Private Insurance  Residential Substance Use Treatment Services   [facility]  [address]  [place], [place] [address]  [phone] or [phone] Detox (Medicare, Medicaid, private insurance, and self pay)  Residential Rehab 14 days (Medicare, Medicaid, private insurance, and self pay)   [facility])  [address] [place], [place]  [phone]  Female and Female Detox (Self Pay and Medicaid limited availability)  Rehab only Female (Medicaid and self pay only)   [facility]      [address]   [place], [place] [address]  [phone] or [phone] Detox and Residential Treatment Private Insurance Only   [facility]  [address]  [place], [place] [address]  [phone]  Treatment Only, must make assessment appointment, and must be sober for assessment appointment.  Self Pay Only, Medicare A&B, [place] Medicaid, [place] ID only! *Transportation assistance offered from [employer] [address]  [facility]     [address] [place], [place] [address] Walk in interviews M-Sat 8-4p No pending legal charges [phone]  [facility]ATC:  [facility]  [address] [place], [place] [phone] (Self Pay, Medicaid)  [facility] [address] [place], [place] [address] [phone] Detox and Residential Treatment Medicare and Private Insurance  [facility] [address]  [place], [place] [address] 28 Day Women's Facility: [phone] 28 Day Men's Facility: [phone] Long-term Residential Program:  [phone] Males 25 and Over (No Insurance, upfront fee)  [facility]  [address] [place], [place] [address] [phone] Private Insurance with [employer], Private Pay  [facility] [address] [place], [place] [address] Local [phone] Private Insurance Only  [facility] [address]  [place], [place] [address]  [phone] (Males, upfront fee)  [facility] [place] [address]  [place], [address] [phone] Private Insurance   [facility] Locations  [facility] [place] [facility]  [address]  [place], [place]  [phone] Christian Based Program for individuals experiencing homelessness Self Pay, No insurance  [facility]  Men's program: [facility] [address]  [place],  [address] [phone]  [facility] Women's program: [facility] [address]   [place], [place] [address] ?[phone] ?Christian Based Program for individuals  experiencing homelessness ?Self Pay, No insurance ? ?[facility] ?[address]  ?[place], [place] [address]  ?[phone] ?Christian Based Program for individuals experiencing homelessness ?Self Pay, No insurance ? ?[facility] ?[address]  ?[place], [place] [address] ?[phone] ?Christian Based Program for individuals experiencing homelessness ?Self Pay, No insurance ? ?[facility] ?[address], [place] ?[phone] ?Christian Based Program for males experiencing homelessness ?Self Pay, No insurance ? ?[facility] ? ?Facility Address Phone Ext. Comments  ?[facility] [employer].  ?[place] [phone]  DV Shelter   ?[facility] [address] [place], [place] [address] [phone]  Men/Woman  ?[facility] [address] [place] [address]  ?[place] [phone]  DV Shelter  ?[facility] [address]  ?[place] [phone]  Families' w/Children ?  ?Holiday representative [address]  ?[place] [phone]  Men/Women/Families   ?[employer] [facility] [address] [place].  ?[place] [phone] Men and Women  ?[facility] [employer] [phone]  ?  pregnant/parenting girls and women  ?[facility]  [place] [phone]  ages 16-21  ?[facility] [place] [phone]  Youth ages 11-17  ?[facility] [address] ?[place] [phone]  Men  ?[facility] [address]  ?[place] [phone]  Women  ?[facility] [address]  ?[place] [phone]  Single Women and Women w/Children  ?[facility] [address]  ?[place] [phone] Men/Women/Families   ?[facility] [address]  ?[place] [phone]  DV Shelter  ?[facility] [address]  [place] [phone] Men and Women  ?[facility] [address] [place] [phone]  Men  ?[facility] [address]  ?[place] [phone] Men  ?[facility] [address]  ?[place] [phone]  Single Women and Families  ?[facility] at [facility]. [place] [phone] or  ?[phone]  Pregnant Women  ?[facility] [address]  ?[place]  [phone]  Men/Women and Families  ?      ? ?

## 2021-11-29 ENCOUNTER — Other Ambulatory Visit (HOSPITAL_COMMUNITY): Payer: Self-pay

## 2021-11-30 LAB — CULTURE, BLOOD (ROUTINE X 2)
Culture: NO GROWTH
Culture: NO GROWTH
Special Requests: ADEQUATE
Special Requests: ADEQUATE

## 2022-09-23 ENCOUNTER — Inpatient Hospital Stay (HOSPITAL_COMMUNITY)
Admission: AD | Admit: 2022-09-23 | Discharge: 2022-09-23 | Disposition: A | Discharge status: Home / Self Care | Payer: Medicaid Other | Attending: Obstetrics and Gynecology | Admitting: Obstetrics and Gynecology

## 2022-09-23 ENCOUNTER — Encounter (HOSPITAL_COMMUNITY): Payer: Self-pay

## 2022-09-23 ENCOUNTER — Ambulatory Visit (HOSPITAL_COMMUNITY)
Admission: EM | Admit: 2022-09-23 | Discharge: 2022-09-23 | Disposition: A | Discharge status: Home / Self Care | Payer: Medicaid Other | Attending: Physician Assistant | Admitting: Physician Assistant

## 2022-09-23 ENCOUNTER — Inpatient Hospital Stay (HOSPITAL_COMMUNITY): Payer: Medicaid Other

## 2022-09-23 ENCOUNTER — Other Ambulatory Visit: Payer: Self-pay

## 2022-09-23 DIAGNOSIS — F17210 Nicotine dependence, cigarettes, uncomplicated: Secondary | ICD-10-CM | POA: Diagnosis not present

## 2022-09-23 DIAGNOSIS — O034 Incomplete spontaneous abortion without complication: Secondary | ICD-10-CM | POA: Diagnosis present

## 2022-09-23 DIAGNOSIS — R053 Chronic cough: Secondary | ICD-10-CM | POA: Insufficient documentation

## 2022-09-23 DIAGNOSIS — R531 Weakness: Secondary | ICD-10-CM | POA: Diagnosis present

## 2022-09-23 DIAGNOSIS — R1030 Lower abdominal pain, unspecified: Secondary | ICD-10-CM | POA: Diagnosis present

## 2022-09-23 DIAGNOSIS — Z3201 Encounter for pregnancy test, result positive: Secondary | ICD-10-CM | POA: Diagnosis present

## 2022-09-23 DIAGNOSIS — Z9189 Other specified personal risk factors, not elsewhere classified: Secondary | ICD-10-CM | POA: Diagnosis present

## 2022-09-23 DIAGNOSIS — N3001 Acute cystitis with hematuria: Secondary | ICD-10-CM | POA: Diagnosis not present

## 2022-09-23 DIAGNOSIS — R634 Abnormal weight loss: Secondary | ICD-10-CM | POA: Insufficient documentation

## 2022-09-23 DIAGNOSIS — Z881 Allergy status to other antibiotic agents status: Secondary | ICD-10-CM | POA: Diagnosis not present

## 2022-09-23 DIAGNOSIS — K130 Diseases of lips: Secondary | ICD-10-CM | POA: Diagnosis present

## 2022-09-23 DIAGNOSIS — Z3A12 12 weeks gestation of pregnancy: Secondary | ICD-10-CM | POA: Diagnosis not present

## 2022-09-23 DIAGNOSIS — R1900 Intra-abdominal and pelvic swelling, mass and lump, unspecified site: Secondary | ICD-10-CM | POA: Diagnosis present

## 2022-09-23 LAB — POCT URINALYSIS DIPSTICK, ED / UC
Bilirubin Urine: NEGATIVE
Glucose, UA: NEGATIVE mg/dL
Ketones, ur: NEGATIVE mg/dL
Nitrite: POSITIVE — AB
Protein, ur: 30 mg/dL — AB
Specific Gravity, Urine: 1.025 (ref 1.005–1.030)
Urobilinogen, UA: 2 mg/dL — ABNORMAL HIGH (ref 0.0–1.0)
pH: 6 (ref 5.0–8.0)

## 2022-09-23 LAB — COMPREHENSIVE METABOLIC PANEL
ALT: 21 U/L (ref 0–44)
AST: 19 U/L (ref 15–41)
Albumin: 2.9 g/dL — ABNORMAL LOW (ref 3.5–5.0)
Alkaline Phosphatase: 74 U/L (ref 38–126)
Anion gap: 8 (ref 5–15)
BUN: 11 mg/dL (ref 6–20)
CO2: 28 mmol/L (ref 22–32)
Calcium: 8.6 mg/dL — ABNORMAL LOW (ref 8.9–10.3)
Chloride: 97 mmol/L — ABNORMAL LOW (ref 98–111)
Creatinine, Ser: 0.54 mg/dL (ref 0.44–1.00)
GFR, Estimated: >60 mL/min (ref >60)
Glucose, Bld: 88 mg/dL (ref 70–99)
Potassium: 3.9 mmol/L (ref 3.5–5.1)
Sodium: 133 mmol/L — ABNORMAL LOW (ref 135–145)
Total Bilirubin: 0.4 mg/dL (ref 0.3–1.2)
Total Protein: 7 g/dL (ref 6.5–8.1)

## 2022-09-23 LAB — CBC WITH DIFFERENTIAL/PLATELET
Abs Immature Granulocytes: 0.02 10*3/uL (ref 0.00–0.07)
Basophils Absolute: 0.1 10*3/uL (ref 0.0–0.1)
Basophils Relative: 1 %
Eosinophils Absolute: 0.4 10*3/uL (ref 0.0–0.5)
Eosinophils Relative: 5 %
HCT: 34.5 % — ABNORMAL LOW (ref 36.0–46.0)
Hemoglobin: 11.5 g/dL — ABNORMAL LOW (ref 12.0–15.0)
Immature Granulocytes: 0 %
Lymphocytes Relative: 26 %
Lymphs Abs: 1.8 10*3/uL (ref 0.7–4.0)
MCH: 29.5 pg (ref 26.0–34.0)
MCHC: 33.3 g/dL (ref 30.0–36.0)
MCV: 88.5 fL (ref 80.0–100.0)
Monocytes Absolute: 0.7 10*3/uL (ref 0.1–1.0)
Monocytes Relative: 11 %
Neutro Abs: 4 10*3/uL (ref 1.7–7.7)
Neutrophils Relative %: 57 %
Platelets: 404 10*3/uL — ABNORMAL HIGH (ref 150–400)
RBC: 3.9 MIL/uL (ref 3.87–5.11)
RDW: 11.9 % (ref 11.5–15.5)
WBC: 7 10*3/uL (ref 4.0–10.5)
nRBC: 0 % (ref 0.0–0.2)

## 2022-09-23 LAB — HEPATITIS PANEL, ACUTE
HCV Ab: REACTIVE — AB
Hep A IgM: NONREACTIVE
Hep B C IgM: NONREACTIVE
Hepatitis B Surface Ag: NONREACTIVE

## 2022-09-23 LAB — HIV ANTIBODY (ROUTINE TESTING W REFLEX): HIV Screen 4th Generation wRfx: NONREACTIVE

## 2022-09-23 LAB — POC URINE PREG, ED: Preg Test, Ur: POSITIVE — AB

## 2022-09-23 LAB — ABO/RH: ABO/RH(D): A POS

## 2022-09-23 LAB — HCG, QUANTITATIVE, PREGNANCY
hCG, Beta Chain, Quant, S: 24 m[IU]/mL — ABNORMAL HIGH (ref <5)
hCG, Beta Chain, Quant, S: 24 m[IU]/mL — ABNORMAL HIGH (ref <5)

## 2022-09-23 LAB — TSH: TSH: 0.816 u[IU]/mL (ref 0.350–4.500)

## 2022-09-23 LAB — T4, FREE: Free T4: 1.18 ng/dL — ABNORMAL HIGH (ref 0.61–1.12)

## 2022-09-23 MED ORDER — CEPHALEXIN 500 MG PO CAPS: 500.0000 mg | ORAL_CAPSULE | Freq: Four times a day (QID) | ORAL | 0 refills | Status: AC

## 2022-09-23 NOTE — ED Notes (Signed)
Patient is being discharged from the Urgent Care and sent to the Emergency Department via POV . Per Erin,PA, patient is in need of higher level of care due to need of further evaluation. Patient is aware and verbalizes understanding of plan of care.  Vitals:   09/23/22 1507  BP: 120/71  Pulse: 69  Resp: 18  Temp: 97.7 F (36.5 C)  SpO2: 95%

## 2022-09-23 NOTE — Discharge Instructions (Addendum)
I will contact you with your lab work if anything is positive or abnormal.  You do have a urinary tract infection.  Start cephalexin 4 times daily.  As we discussed, you have a positive pregnancy test with lower abdominal pain.  Please go to the MAU for further evaluation and management.  If you have any worsening or changing symptoms you need to be seen immediately.  Someone should reach out to you to help schedule an appointment with a primary care.  If you do not hear from them within a week please contact our office.

## 2022-09-23 NOTE — MAU Provider Note (Signed)
History     CSN: [id number]  Arrival date and time: 09/23/22 1625   Event Date/Time   First Provider Initiated Contact with Patient 09/23/22 1753      Chief Complaint  Patient presents with   Vaginal Bleeding   HPI  Coralene Todd is a 42 y.o. who presents from urgent care for evaluation of vaginal spotting. Patient reports she had a miscarriage about 6 weeks ago of an approximately [redacted] weeks pregnant. She did not seek care for this miscarriage. Since then, she has had a small amount of spotting every day. She denies any pain.    OB History   No obstetric history on file.     Past Medical History:  Diagnosis Date   Kidney contusion    Long Q-T syndrome    PMDD (premenstrual dysphoric disorder)    Polysubstance abuse ([place])    a. 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 ? Needs INH.   Rib fractures    Tobacco abuse     No past surgical history on file.  Family History  Problem Relation Age of Onset   Healthy Mother    Healthy Father    CAD Other        Grandmother's father   Other Other        Mother was worked up remotely for ?parasympathetic nervous system problem but cardiac workup was reportedly unremarkable    Social History   Tobacco Use   Smoking status: Every Day    Packs/day: 2.00    Types: Cigarettes   Smokeless tobacco: Never   Tobacco comments:    Smoker since age 47  Vaping Use   Vaping Use: Never used  Substance Use Topics   Alcohol use: Not Currently   Drug use: Not Currently    Types: Heroin    Comment: Remote history of opiates, heroin - clean for 8 years     Allergies:  Allergies  Allergen Reactions   Iodinated Contrast Media Shortness Of Breath and Palpitations    CTA chest PE done 08/2014. Pt had SOB, chest heaviness, near syncope after contrast was administered. (Pt is unable to have Epinephrine due to cardiac issue)    Clindamycin/Lincomycin     palpitations   Other Other (See Comments)    QT Prolonging Drugs   Sulfa Antibiotics Nausea Only    No medications prior to admission.    Review of Systems  Constitutional: Negative.  Negative for fatigue and fever.  HENT: Negative.    Respiratory: Negative.  Negative for shortness of breath.   Cardiovascular: Negative.  Negative for chest pain.  Gastrointestinal: Negative.  Negative for abdominal pain, constipation, diarrhea, nausea and vomiting.  Genitourinary:  Positive for vaginal bleeding. Negative for dysuria and vaginal discharge.  Neurological: Negative.  Negative for dizziness and headaches.   Physical Exam   Blood pressure (!) 99/56, pulse 85, temperature 98 F (36.7 C), temperature source Oral, resp. rate 18, SpO2 100 %.  Patient Vitals for the past 24 hrs:  BP Temp Temp src Pulse Resp SpO2  09/23/22 1646 (!) 99/56 98 F (36.7 C) Oral 85 18 100 %    Physical Exam Vitals and nursing note reviewed.  Constitutional:      General: She is not in acute distress.    Appearance: She is well-developed.  HENT:     Head: Normocephalic.  Eyes:  Pupils: Pupils are equal, round, and reactive to light.  Cardiovascular:     Rate and Rhythm: Normal rate and regular rhythm.     Heart sounds: Normal heart sounds.  Pulmonary:     Effort: Pulmonary effort is normal. No respiratory distress.     Breath sounds: Normal breath sounds.  Abdominal:     General: Bowel sounds are normal. There is no distension.     Palpations: Abdomen is soft.     Tenderness: There is no abdominal tenderness.  Skin:    General: Skin is warm and dry.  Neurological:     Mental Status: She is alert and oriented to person, place, and time.  Psychiatric:        Mood and Affect: Mood normal.        Behavior: Behavior normal.        Thought Content: Thought content normal.        Judgment: Judgment normal.    MAU Course  Procedures  Results for orders placed or  performed during the hospital encounter of 09/23/22 (from the past 24 hour(s))  hCG, quantitative, pregnancy     Status: Abnormal   Collection Time: 09/23/22  4:53 PM  Result Value Ref Range   hCG, Beta Chain, Quant, S 24 (H) <5 mIU/mL  ABO/Rh     Status: None   Collection Time: 09/23/22  4:53 PM  Result Value Ref Range   ABO/RH(D) A POS    No rh immune globuloin      NOT A RH IMMUNE GLOBULIN CANDIDATE, PT RH POSITIVE Performed at [facility] [address] [place], [place] [address]      US OB LESS THAN 14 WEEKS WITH [place] TRANSVAGINAL  Result Date: 09/23/2022 CLINICAL DATA:  Positive urine pregnancy test. Miscarriage 6 weeks ago. Abdominal pain affecting pregnancy. Quantitative beta hCG pending. Patient is unsure of last menstrual period. EXAM: OBSTETRIC <14 WK [place] AND TRANSVAGINAL OB US TECHNIQUE: Both transabdominal and transvaginal ultrasound examinations were performed for complete evaluation of the gestation as well as the maternal uterus, adnexal regions, and pelvic cul-de-sac. Transvaginal technique was performed to assess early pregnancy. COMPARISON:  Pelvic ultrasound 06/02/2016 FINDINGS: Intrauterine gestational sac: None Yolk sac:  Not Visualized. Embryo:  Not Visualized. Subchorionic hemorrhage:  None visualized. Maternal uterus/adnexae: The maternal uterine endometrium is mildly hyperechoic and is markedly enlarged, measuring up to 4.6 cm in AP dimension. There is mildly increased internal color flow vascularity. The maternal left ovary measures 2.6 x 1.0 x 1.5 cm. There is an echogenic, heterogeneous mass within the right adnexa with some areas of acoustic shadowing compatible with a dermoid, measuring up to approximately 6.1 x 3.8 x 5.3 cm. This was not seen on 06/02/2016 prior ultrasound. No free fluid is seen within the pelvis. IMPRESSION: 1. No live intrauterine pregnancy is seen. 2. The endometrium is markedly thickened, measuring up to 4.6 cm. There is mildly increased  vascularity. This may be secondary to recent reported pregnancy. Recommend clinical correlation with trend of quantitative beta HCG. No definitive focal retained products of conception. 3. Echogenic, heterogeneous mass within the right adnexa measuring up to 6.1 cm, compatible with a dermoid. Consider annual ultrasound follow-up to assess stability. Electronically Signed   By: Yvonne Kendall M.D.   On: 09/23/2022 17:33     MDM Labs ordered and reviewed.   UA, UPT CBC, HCG ABO/Rh- A Pos Wet prep and gc/chlamydia US OB Comp Less 14 weeks with Transvaginal  CNM consulted  with Dr. Elgie Congo regarding presentation and results- MD recommends non urgent surgical management next week. MD will facilitate scheduling. Patient agreeable to plan of care. Instructed patient to be NPO after midnight and await scheduling call  Assessment and Plan   1. Retained products of conception after miscarriage   2. [redacted] weeks gestation of pregnancy     -Discharge home in stable condition -Vaginal bleeding precautions discussed -Patient advised to follow-up with [facility] for surgery next week -Patient may return to MAU as needed or if her condition were to change or worsen  Wende Mott, CNM 09/23/2022, 5:54 PM

## 2022-09-23 NOTE — ED Triage Notes (Signed)
Pt c/o sores around mouth and on top of her head since before christmas.   Pt c/o a knot above umbilical area x2 years.  Pt states was 3 months pregnant and had a miscarriage 6 weeks ago. States passed the fetus on her on. States still having slight bleeding and having a vaginal discharge and odor.   Pt states she thinks her sores to her face syphilis.

## 2022-09-23 NOTE — ED Provider Notes (Signed)
[facility]    CSN: [id number] Arrival date & time: 09/23/22  1428      History   Chief Complaint Chief Complaint  Patient presents with   Rash   SEXUALLY TRANSMITTED DISEASE    HPI Lessia Kowalske is a 42 y.o. female.   Patient presents today companied by her mother help provide 7 of history.  She reports today several concerns.  Her primary concern is that she has had a sore on her upper mouth that has been worsening and spreading to her inner lip.  This has been present for several months and has not resolved.  She has tried moisturizing treatments without improvement.  Denies history of herpes.  She is concerned that it might be syphilis based on Internet research that she and her mother did.  She does report that until recently she was homeless and on the streets for approximately 3 years engaging in high risk activities.  In addition, she has a swelling in her epigastrium.  She reports that this can be tender to palpation but generally is not painful.  She is eating and drinking normally.  She denies previous abdominal surgery.  In addition, she reports worsening weakness and weight loss over the past several months.  She is eating and drinking more normally than when she was on the streets but continues to have significant wasting.  She does report that she was diagnosed with latent TB but was never treated after having multiple clear x-rays.  She does report an ongoing cough but has attributed this to smoking.  She is a current everyday smoker and used to smoke 2 packs/day but has decreased this to approximately 5 cigarettes/day.  She denies any history of thyroid condition.  Denies diagnosis of HIV but has not had any recent testing.  Denies any chest pain or shortness of breath.  Patient does report that a few weeks ago she had a miscarriage.  She is unsure exactly how far along she was but believes it was about 12 weeks.  She has had abnormal vaginal bleeding and  malodorous discharge.  Denies any fever, nausea, vomiting.  Reports the miscarriage was approximately 6 weeks ago.  Reports that she is sure that she passed the entire fetus as she saw head, limbs, ribs.  She has had some ongoing bleeding since that time but it has improved recently.  She has not seen anyone since the miscarriage.    Past Medical History:  Diagnosis Date   Kidney contusion    Long Q-T syndrome    PMDD (premenstrual dysphoric disorder)    Polysubstance abuse ([place])    a. 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 ? Needs INH.   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 No pertinent surgical history.  OB History   No obstetric history on file.  Home Medications    Prior to Admission medications   Medication Sig Start Date End Date Taking? Authorizing Provider  cephALEXin (KEFLEX) 500 MG capsule Take 1 capsule (500 mg total) by mouth 4 (four) times daily. 09/23/22  Yes Tyren Dugar K, PA-C  DULoxetine (CYMBALTA) 20 MG capsule Take 20 mg by mouth daily.  10/27/19  [provider]  gabapentin (NEURONTIN) 300 MG capsule Take 1 capsule (300 mg total) by mouth 2 (two) times daily. 05/18/19 10/27/19  Robyn Haber, MD    Family History Family History  Problem Relation Age of Onset   Healthy Mother    Healthy  Father    CAD Other        Grandmother's father   Other Other        Mother was worked up remotely for ?parasympathetic nervous system problem but cardiac workup was reportedly unremarkable    Social History Social History   Tobacco Use   Smoking status: Every Day    Packs/day: 2.00    Types: Cigarettes   Smokeless tobacco: Never   Tobacco comments:    Smoker since age 66  Vaping Use   Vaping Use: Never used  Substance Use Topics   Alcohol use: Not Currently   Drug use: Not Currently    Types: Heroin    Comment: Remote history of opiates, heroin - clean for 8 years      Allergies   Iodinated contrast media, Clindamycin/lincomycin, Other, and Sulfa antibiotics   Review of Systems Review of Systems  Constitutional:  Positive for activity change and unexpected weight change. Negative for appetite change, fatigue and fever.  HENT:  Positive for mouth sores.   Respiratory:  Positive for cough. Negative for shortness of breath.   Cardiovascular:  Negative for chest pain.  Gastrointestinal:  Positive for abdominal pain. Negative for diarrhea, nausea and vomiting.  Genitourinary:  Positive for dysuria, menstrual problem, vaginal bleeding and vaginal discharge. Negative for frequency and vaginal pain.     Physical Exam Triage Vital Signs ED Triage Vitals  Enc Vitals Group     BP 09/23/22 1507 120/71     Pulse Rate 09/23/22 1507 69     Resp 09/23/22 1507 18     Temp 09/23/22 1507 97.7 F (36.5 C)     Temp Source 09/23/22 1507 Oral     SpO2 09/23/22 1507 95 %     Weight --      Height --      Head Circumference --      Peak Flow --      Pain Score 09/23/22 1508 7     Pain Loc --      Pain Edu? --      Excl. in [place]? --    No data found.  Updated Vital Signs BP 120/71 (BP Location: Left Arm)   Pulse 69   Temp 97.7 F (36.5 C) (Oral)   Resp 18   SpO2 95%   Visual Acuity Right Eye Distance:   Left Eye Distance:   Bilateral Distance:    Right Eye Near:    Left Eye Near:    Bilateral Near:     Physical Exam Vitals reviewed.  Constitutional:      General: She is awake. She is not in acute distress.    Appearance: Normal appearance. She is well-developed. She is ill-appearing.     Comments: Very pleasant female appears older than stated age thin and ill-appearing but in no acute distress  HENT:     Head: Normocephalic and atraumatic.  Cardiovascular:     Rate and Rhythm: Normal rate and regular rhythm.     Heart sounds: Normal heart sounds, S1 normal and S2 normal. No murmur heard. Pulmonary:     Effort: Pulmonary effort is normal.     Breath sounds: Wheezing present. No rhonchi or rales.     Comments: Scattered wheezing Abdominal:     General: Bowel sounds are normal.     Palpations: Abdomen is soft.     Tenderness: There is abdominal tenderness in the right lower quadrant, suprapubic area and left lower quadrant. There is no right CVA tenderness, left CVA tenderness, guarding or rebound.     Hernia: A hernia is present. Hernia is present in the ventral area.     Comments: Mild tenderness palpation and lower abdomen.  No evidence of acute abdomen on physical exam.  Small mobile mass noted superior to the umbilicus that is not reducible.  No overlying erythema or significant tenderness palpation.  Psychiatric:        Behavior: Behavior is cooperative.      UC Treatments / Results  Labs (all labs ordered are listed, but only abnormal results are displayed) Labs Reviewed  POCT URINALYSIS DIPSTICK, ED / UC - Abnormal; Notable for the following components:      Result Value   Hgb urine dipstick LARGE (*)    Protein, ur 30 (*)    Urobilinogen, UA 2.0 (*)    Nitrite POSITIVE (*)    Leukocytes,Ua MODERATE (*)    All other components within normal limits  POC URINE PREG, ED - Abnormal; Notable for the following components:   Preg Test, Ur POSITIVE (*)    All other components within normal limits  URINE CULTURE  COMPREHENSIVE  METABOLIC PANEL  CBC WITH DIFFERENTIAL/PLATELET  HCG, QUANTITATIVE, PREGNANCY  HIV ANTIBODY (ROUTINE TESTING W REFLEX)  RPR  HEPATITIS PANEL, ACUTE  TSH  T4, FREE  CERVICOVAGINAL ANCILLARY ONLY    EKG   Radiology No results found.  Procedures Procedures (including critical care time)  Medications Ordered in UC Medications - No data to display  Initial Impression / Assessment and Plan / UC Course  I have reviewed the triage vital signs and the nursing notes.  Pertinent labs & imaging results that were available during my care of the patient were reviewed by me and considered in my medical decision making (see chart for details).     Patient is well-appearing, afebrile, nontoxic, nontachycardic.  UA was obtained that was positive for infection.  Will start cephalexin 500 mg 4 times daily for 5 days.  Urine culture was obtained and is pending.  Urine pregnancy test remain positive.  We discussed that 6 weeks after miscarriage I would expect that this should have normalized and so given her ongoing discomfort and positive pregnancy test she should be evaluated at the MAU.  She is agreeable to this and will go directly to the MAU for further evaluation and management.  Unclear etiology of lip ulceration.  Low suspicion for herpes given area has gradually been expanding and worsening over several months without characteristic vesicles.  We discussed that it is possible this is related to syphilis and STI testing including HIV, hepatitis, RPR obtained today.  She was encouraged to keep this area moisturize.  Will make additional recommendations based on laboratory findings.  Unclear etiology of weight loss and generalized weakness.  Discussed this could be related to previous malnutrition  but will investigate additional causes.  CBC, CMP, HIV, TSH was obtained today and is pending.  Recommended that she gradually increase her activity level as tolerated to make sure that she is eating and  drinking plenty of fluids.  We did discuss that it is possible that symptoms could be related to tuberculosis or other lung issue.  Initially was going to order chest x-ray but given she had a positive pregnancy test we will defer this for the time being.  We discussed that she would ultimately need to follow-up with primary care for further evaluation and management.  She does not currently have a primary care provider so we will try to establish her with someone via PCP assistance.  Discussed that if she has negative workup at the MAU and there is no concern for ongoing pregnancy we can consider chest x-ray in the future.  Mass consistent with ventral hernia.  No evidence of strangulated or incarcerated hernia.  Discussed that ultimately we would want imaging to investigate this further but this is not something we can arrange in urgent care.  She was encouraged to follow-up with primary care as we discussed.  Discussed that if she has any signs/symptoms of incarcerated or strangulated hernia she would need to go to the emergency room to which she and mother expressed understanding.  Final Clinical Impressions(s) / UC Diagnoses   Final diagnoses:  Acute cystitis with hematuria  Lower abdominal pain  Positive pregnancy test  Loss of weight  Generalized weakness  History of sexual behavior with high risk of exposure to communicable disease  Lip ulcer  Chronic cough  Abdominal mass, unspecified abdominal location     Discharge Instructions      I will contact you with your lab work if anything is positive or abnormal.  You do have a urinary tract infection.  Start cephalexin 4 times daily.  As we discussed, you have a positive pregnancy test with lower abdominal pain.  Please go to the MAU for further evaluation and management.  If you have any worsening or changing symptoms you need to be seen immediately.  Someone should reach out to you to help schedule an appointment with a primary care.  If  you do not hear from them within a week please contact our office.     ED Prescriptions     Medication Sig Dispense Auth. Provider   cephALEXin (KEFLEX) 500 MG capsule Take 1 capsule (500 mg total) by mouth 4 (four) times daily. 20 capsule [place], Derry Skill, PA-C      PDMP not reviewed this encounter.   Terrilee Croak, PA-C 09/23/22 1603

## 2022-09-23 NOTE — MAU Note (Signed)
[name] is a 42 y.o. at Unknown here in MAU reporting: she was seen @ Urgent Care today, had a + UPT today,  and had miscarriage approximately 6 weeks ago.  Told she needed further evaluation to determine if there were still POC in uterus.  Reports mild intermittent cramping.  Endorses intermittent  VB with questionable tissue, but not saturating a sanitary napkin wearing panty liners. LMP: NA Onset of complaint: 2 weeks ago Pain score: 6 Vitals:   09/23/22 1646  BP: (!) 99/56  Pulse: 85  Resp: 18  Temp: 98 F (36.7 C)  SpO2: 100%     FHT:NA Lab orders placed from triage:

## 2022-09-23 NOTE — ED Notes (Signed)
Labs collected and label.

## 2022-09-24 LAB — RPR
RPR Ser Ql: REACTIVE — AB
RPR Titer: 1:32 {titer}

## 2022-09-25 LAB — URINE CULTURE: Culture: >=100000 — AB

## 2022-09-26 LAB — CERVICOVAGINAL ANCILLARY ONLY
Bacterial Vaginitis (gardnerella): POSITIVE — AB
Candida Glabrata: NEGATIVE
Candida Vaginitis: NEGATIVE
Chlamydia: NEGATIVE
Comment: NEGATIVE
Comment: NEGATIVE
Comment: NEGATIVE
Comment: NEGATIVE
Comment: NEGATIVE
Comment: NORMAL
Neisseria Gonorrhea: POSITIVE — AB
Trichomonas: NEGATIVE

## 2022-09-26 LAB — T.PALLIDUM AB, TOTAL: T Pallidum Abs: REACTIVE — AB

## 2022-10-17 ENCOUNTER — Ambulatory Visit (HOSPITAL_COMMUNITY)
Admission: EM | Admit: 2022-10-17 | Discharge: 2022-10-17 | Disposition: A | Discharge status: Home / Self Care | Payer: Medicaid Other | Attending: Family Medicine | Admitting: Family Medicine

## 2022-10-17 ENCOUNTER — Telehealth (HOSPITAL_COMMUNITY): Payer: Self-pay | Admitting: Emergency Medicine

## 2022-10-17 ENCOUNTER — Encounter (HOSPITAL_COMMUNITY): Payer: Self-pay

## 2022-10-17 DIAGNOSIS — N3000 Acute cystitis without hematuria: Secondary | ICD-10-CM | POA: Diagnosis not present

## 2022-10-17 DIAGNOSIS — A549 Gonococcal infection, unspecified: Secondary | ICD-10-CM | POA: Diagnosis not present

## 2022-10-17 DIAGNOSIS — A539 Syphilis, unspecified: Secondary | ICD-10-CM

## 2022-10-17 DIAGNOSIS — R1030 Lower abdominal pain, unspecified: Secondary | ICD-10-CM

## 2022-10-17 DIAGNOSIS — B1920 Unspecified viral hepatitis C without hepatic coma: Secondary | ICD-10-CM | POA: Diagnosis not present

## 2022-10-17 LAB — POC URINE PREG, ED: Preg Test, Ur: NEGATIVE

## 2022-10-17 MED ORDER — DOXYCYCLINE HYCLATE 100 MG PO CAPS: 100.0000 mg | ORAL_CAPSULE | Freq: Two times a day (BID) | ORAL | 0 refills | Status: AC

## 2022-10-17 MED ORDER — LIDOCAINE HCL (PF) 1 % IJ SOLN
INTRAMUSCULAR | Status: AC
  Filled 2022-10-17: qty 2

## 2022-10-17 MED ORDER — CEFTRIAXONE SODIUM 500 MG IJ SOLR
500.0000 mg | INTRAMUSCULAR | Status: DC
  Administered 2022-10-17: 500 mg via INTRAMUSCULAR

## 2022-10-17 MED ORDER — PENICILLIN G BENZATHINE 1200000 UNIT/2ML IM SUSY
PREFILLED_SYRINGE | INTRAMUSCULAR | Status: AC
  Filled 2022-10-17: qty 4

## 2022-10-17 MED ORDER — CEFTRIAXONE SODIUM 500 MG IJ SOLR
INTRAMUSCULAR | Status: AC
  Filled 2022-10-17: qty 500

## 2022-10-17 MED ORDER — PENICILLIN G BENZATHINE 1200000 UNIT/2ML IM SUSY
2.4000 10*6.[IU] | PREFILLED_SYRINGE | Freq: Once | INTRAMUSCULAR | Status: AC
  Administered 2022-10-17: 2.4 10*6.[IU] via INTRAMUSCULAR

## 2022-10-17 NOTE — ED Provider Notes (Signed)
[place]    CSN: [id number] Arrival date & time: 10/17/22  1415      History   Chief Complaint Chief Complaint  Patient presents with   Abdominal Pain    HPI [name] is a 42 y.o. female.    Abdominal Pain  Here for abdominal pain, some pelvic and some upper. Has a hernia, carrying heavy things.   Has lots of sores on her lip and vaginal area and head. Tested pos for syphilis with 1:32 RPR on 3/8.   Also tested pos for GC then  Had positive UPT on 3/8 and was sent to [facility]. Was to return for D & C and did not due to homelessness.   Urine c/s pos for E coli at the time; has picked up the abx, but has not taken  Hep C pos at the time also.  Bled heavily since her visit to the MAU on 3/8, until a couple of days ago.  No v/d. No f/c recently. Is able to eat.  Has some MRSA spots on back and buttocks.   Past Medical History:  Diagnosis Date   Kidney contusion    Long Q-T syndrome    PMDD (premenstrual dysphoric disorder)    Polysubstance abuse    a. 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 ? Needs INH.   Rib fractures    Tobacco abuse     Patient Active Problem List   Diagnosis Date Noted   Heroin withdrawal 11/25/2021   Polysubstance abuse 11/25/2021   SIRS (systemic inflammatory response syndrome) 11/25/2021   Cellulitis of right axilla 11/25/2021   UTI (urinary tract infection) 11/25/2021   Hypokalemia 11/25/2021   Right ovarian cyst 04/19/2016   Methadone maintenance therapy patient 03/28/2016   Abdominal pain, left lower quadrant 03/28/2016   Tobacco abuse 03/02/2016   Palpitations 08/21/2013   Prolonged QT interval 01/28/2013    History reviewed. No pertinent surgical history.  OB History   No obstetric history on file.      Home Medications    Prior to Admission medications   Medication Sig Start Date End Date  Taking? Authorizing Provider  doxycycline (VIBRAMYCIN) 100 MG capsule Take 1 capsule (100 mg total) by mouth 2 (two) times daily for 7 days. 10/17/22 10/24/22 Yes Betzabeth Derringer, Gwenlyn Perking, MD  cephALEXin (KEFLEX) 500 MG capsule Take 1 capsule (500 mg total) by mouth 4 (four) times daily. 09/23/22   Raspet, Derry Skill, PA-C  DULoxetine (CYMBALTA) 20 MG capsule Take 20 mg by mouth daily.  10/27/19  [provider]  gabapentin (NEURONTIN) 300 MG capsule Take 1 capsule (300 mg total) by mouth 2 (two) times daily. 05/18/19 10/27/19  Robyn Haber, MD    Family History Family History  Problem Relation Age of Onset   Healthy Mother    Healthy Father    CAD Other        Grandmother's father   Other Other        Mother was worked up remotely for ?parasympathetic nervous system problem but cardiac workup was reportedly unremarkable    Social History Social History   Tobacco Use   Smoking status: Every Day    Packs/day: 2    Types: Cigarettes   Smokeless tobacco: Never   Tobacco comments:    Smoker since age 74  Vaping Use   Vaping Use: Never used  Substance  Use Topics   Alcohol use: Not Currently   Drug use: Not Currently    Types: Heroin    Comment: Remote history of opiates, heroin - clean for 8 years      Allergies   Iodinated contrast media, Clindamycin/lincomycin, Other, and Sulfa antibiotics   Review of Systems Review of Systems  Gastrointestinal:  Positive for abdominal pain.     Physical Exam Triage Vital Signs ED Triage Vitals  Enc Vitals Group     BP 10/17/22 1514 117/77     Pulse Rate 10/17/22 1514 99     Resp 10/17/22 1514 16     Temp 10/17/22 1514 99 F (37.2 C)     Temp Source 10/17/22 1514 Oral     SpO2 10/17/22 1514 98 %     Weight --      Height --      Head Circumference --      Peak Flow --      Pain Score 10/17/22 1509 10     Pain Loc --      Pain Edu? --      Excl. in [place]? --    No data found.  Updated Vital Signs BP 117/77 (BP Location:  Right Arm)   Pulse 99   Temp 99 F (37.2 C) (Oral)   Resp 16   SpO2 98%   Visual Acuity Right Eye Distance:   Left Eye Distance:   Bilateral Distance:    Right Eye Near:   Left Eye Near:    Bilateral Near:     Physical Exam Vitals reviewed.  Constitutional:      General: She is not in acute distress.    Appearance: She is not toxic-appearing.  HENT:     Left Ear: Tympanic membrane normal.     Mouth/Throat:     Mouth: Mucous membranes are moist.     Pharynx: No oropharyngeal exudate or posterior oropharyngeal erythema.  Eyes:     Extraocular Movements: Extraocular movements intact.     Conjunctiva/sclera: Conjunctivae normal.     Pupils: Pupils are equal, round, and reactive to light.  Cardiovascular:     Rate and Rhythm: Normal rate and regular rhythm.     Heart sounds: No murmur heard. Pulmonary:     Effort: Pulmonary effort is normal. No respiratory distress.     Breath sounds: No stridor. No wheezing, rhonchi or rales.  Abdominal:     Palpations: Abdomen is soft.     Tenderness: There is no abdominal tenderness.  Musculoskeletal:     Cervical back: Neck supple.  Lymphadenopathy:     Cervical: No cervical adenopathy.  Skin:    Capillary Refill: Capillary refill takes less than 2 seconds.     Coloration: Skin is not jaundiced or pale.     Comments: There are numerous erythematous ulcerations on face, arms,trunk. One on right upper lip is a little swollen. Has rash inside her mouth on her hard and soft palate.  Neurological:     General: No focal deficit present.     Mental Status: She is alert and oriented to person, place, and time.  Psychiatric:        Behavior: Behavior normal.      UC Treatments / Results  Labs (all labs ordered are listed, but only abnormal results are displayed) Labs Reviewed  POC URINE PREG, ED    EKG   Radiology No results found.  Procedures Procedures (including critical care time)  Medications Ordered in  UC Medications  cefTRIAXone (ROCEPHIN) injection 500 mg (has no administration in time range)  penicillin g benzathine (BICILLIN LA) 1200000 UNIT/2ML injection 2.4 Million Units (has no administration in time range)    Initial Impression / Assessment and Plan / UC Course  I have reviewed the triage vital signs and the nursing notes.  Pertinent labs & imaging results that were available during my care of the patient were reviewed by me and considered in my medical decision making (see chart for details).       UPT is negative today  Mom is in attendance and states she is going to help pt get to appts and f/u.  Tx administered here for GC with ceftriaxone and for syphilis with 1.2 million units benzathine pcn.  I have asked her to schedule with primary care, and staff will help her also.  Contact info given for gyn for the irregular bleeding and for infectious disease for the hep C  She was prescribed Keflex for the UTI which should work, but she did test positive last year for MRSA.  I am concerned that the cellulitis I see is MRSA and so doxycycline is also sent in. Final Clinical Impressions(s) / UC Diagnoses   Final diagnoses:  Lower abdominal pain  Syphilis  Gonorrhea  Hepatitis C virus infection without hepatic coma, unspecified chronicity  Acute cystitis without hematuria     Discharge Instructions      You have been given a shot of ceftriaxone 500 mg; this is for gonorrhea  You have also been given a shot of penicillin 2,400,000 units today; this is for syphilis  You were prescribed cephalexin for the urinary infection please take that as prescribed.  I am sending doxycycline in for the possible staph infection on your back.  If it is not improving with the cephalexin that you are taking for the UTI, then please pick that prescription up and take the doxycycline also.  You can use the QR code/website at the back of the summary paperwork to schedule yourself a new  patient appointment with primary care      ED Prescriptions     Medication Sig Dispense Auth. Provider   doxycycline (VIBRAMYCIN) 100 MG capsule Take 1 capsule (100 mg total) by mouth 2 (two) times daily for 7 days. 14 capsule Windy Carina, Gwenlyn Perking, MD      PDMP not reviewed this encounter.   Barrett Henle, MD 10/17/22 [phone]

## 2022-10-17 NOTE — ED Triage Notes (Signed)
Staff tried to contact PT by mobile after Pt did not answer when called in front Lobby. Greater reports Pt was walking in and out of building.

## 2022-10-17 NOTE — Telephone Encounter (Signed)
Letter that was sent for results was returned, undeliverable address on file

## 2022-10-17 NOTE — Discharge Instructions (Addendum)
You have been given a shot of ceftriaxone 500 mg; this is for gonorrhea  You have also been given a shot of penicillin 2,400,000 units today; this is for syphilis  You were prescribed cephalexin for the urinary infection please take that as prescribed.  I am sending doxycycline in for the possible staph infection on your back.  If it is not improving with the cephalexin that you are taking for the UTI, then please pick that prescription up and take the doxycycline also.  You can use the QR code/website at the back of the summary paperwork to schedule yourself a new patient appointment with primary care

## 2022-10-17 NOTE — ED Triage Notes (Addendum)
Pt is here for abdominal pain , pt stated she was refereed to get a [facility] in which she had not been able to do anything due to homelessness. Pt stated she tested positive for syphilis and gonorrhea , Hepc and UTI did not receive any treatment.

## 2022-11-02 ENCOUNTER — Emergency Department (HOSPITAL_COMMUNITY)
Admission: EM | Admit: 2022-11-02 | Discharge: 2022-11-02 | Disposition: A | Discharge status: Home / Self Care | Payer: Medicaid Other | Attending: Emergency Medicine | Admitting: Emergency Medicine

## 2022-11-02 ENCOUNTER — Other Ambulatory Visit (HOSPITAL_COMMUNITY): Payer: Self-pay

## 2022-11-02 ENCOUNTER — Other Ambulatory Visit: Payer: Self-pay

## 2022-11-02 ENCOUNTER — Encounter (HOSPITAL_COMMUNITY): Payer: Self-pay

## 2022-11-02 DIAGNOSIS — T401X1A Poisoning by heroin, accidental (unintentional), initial encounter: Secondary | ICD-10-CM | POA: Insufficient documentation

## 2022-11-02 DIAGNOSIS — R400 Somnolence: Secondary | ICD-10-CM | POA: Insufficient documentation

## 2022-11-02 DIAGNOSIS — T50901A Poisoning by unspecified drugs, medicaments and biological substances, accidental (unintentional), initial encounter: Secondary | ICD-10-CM

## 2022-11-02 LAB — CBC WITH DIFFERENTIAL/PLATELET
Abs Immature Granulocytes: 0.03 10*3/uL (ref 0.00–0.07)
Basophils Absolute: 0.1 10*3/uL (ref 0.0–0.1)
Basophils Relative: 1 %
Eosinophils Absolute: 0.4 10*3/uL (ref 0.0–0.5)
Eosinophils Relative: 5 %
HCT: 37.8 % (ref 36.0–46.0)
Hemoglobin: 12.2 g/dL (ref 12.0–15.0)
Immature Granulocytes: 0 %
Lymphocytes Relative: 31 %
Lymphs Abs: 2.7 10*3/uL (ref 0.7–4.0)
MCH: 28.7 pg (ref 26.0–34.0)
MCHC: 32.3 g/dL (ref 30.0–36.0)
MCV: 88.9 fL (ref 80.0–100.0)
Monocytes Absolute: 1.1 10*3/uL — ABNORMAL HIGH (ref 0.1–1.0)
Monocytes Relative: 13 %
Neutro Abs: 4.3 10*3/uL (ref 1.7–7.7)
Neutrophils Relative %: 50 %
Platelets: 351 10*3/uL (ref 150–400)
RBC: 4.25 MIL/uL (ref 3.87–5.11)
RDW: 12.6 % (ref 11.5–15.5)
WBC: 8.6 10*3/uL (ref 4.0–10.5)
nRBC: 0 % (ref 0.0–0.2)

## 2022-11-02 LAB — BASIC METABOLIC PANEL
Anion gap: 7 (ref 5–15)
BUN: 9 mg/dL (ref 6–20)
CO2: 27 mmol/L (ref 22–32)
Calcium: 8.5 mg/dL — ABNORMAL LOW (ref 8.9–10.3)
Chloride: 101 mmol/L (ref 98–111)
Creatinine, Ser: 0.62 mg/dL (ref 0.44–1.00)
GFR, Estimated: >60 mL/min (ref >60)
Glucose, Bld: 121 mg/dL — ABNORMAL HIGH (ref 70–99)
Potassium: 3.5 mmol/L (ref 3.5–5.1)
Sodium: 135 mmol/L (ref 135–145)

## 2022-11-02 LAB — RAPID URINE DRUG SCREEN, HOSP PERFORMED
Amphetamines: POSITIVE — AB
Barbiturates: NOT DETECTED
Benzodiazepines: NOT DETECTED
Cocaine: POSITIVE — AB
Opiates: POSITIVE — AB
Tetrahydrocannabinol: NOT DETECTED

## 2022-11-02 LAB — PREGNANCY, URINE: Preg Test, Ur: NEGATIVE

## 2022-11-02 LAB — ETHANOL: Alcohol, Ethyl (B): <10 mg/dL (ref <10)

## 2022-11-02 MED ORDER — NALOXONE HCL 4 MG/0.1ML NA LIQD
NASAL | 0 refills | Status: AC
  Filled 2022-11-02: qty 2, 1d supply, fill #0

## 2022-11-02 MED ORDER — SODIUM CHLORIDE 0.9 % IV BOLUS
1000.0000 mL | Freq: Once | INTRAVENOUS | Status: AC
  Administered 2022-11-02: 1000 mL via INTRAVENOUS

## 2022-11-02 NOTE — ED Notes (Signed)
Patient Alert and oriented to baseline. Stable and ambulatory to baseline. Patient verbalized understanding of the discharge instructions.  Patient belongings were taken by the patient.   

## 2022-11-02 NOTE — ED Notes (Signed)
Pt again encouraged to reach out for a ride.

## 2022-11-02 NOTE — ED Triage Notes (Signed)
Arrives EMS from gas station after being "sleepy" at gas station. Admits to using heroin for "maintenance".   Drowsy on arrival with periodic episodes of inability to stay awake.  Declined narcan administration

## 2022-11-02 NOTE — Discharge Instructions (Addendum)
Use Narcan kit as prescribed  Return for new or worsening symptoms

## 2022-11-02 NOTE — ED Provider Notes (Addendum)
42 year old female presenting to the ED with possible heroin overdose.  She was found "sleepy at gas station" with suspected accidental overdose. Pt assumed at shift change from preceding PA-C, Britni Henderly, pending ride to pick pt up. Please see her note for further details of pt presentation, etc. Pt workup essentially unremarkable, UDS positive for multiple substances. Pt alert, oriented, answering questions appropriately, and with nonfocal neurological exam. Narcan prescribed as needed for outpatient setting. PCP follow up provided. Pt given strict ED return precautions, resource guides for substance use treatment. All questions answered and stable for discharge.    Tonette Lederer, PA-C 11/02/22 1638    Tonette Lederer, PA-C 11/02/22 1641    Gwyneth Sprout, MD 11/09/22 0004

## 2022-11-02 NOTE — ED Provider Notes (Signed)
[place] EMERGENCY DEPARTMENT AT [facility] Provider Note   CSN: [id number] Arrival date & time: 11/02/22  4098     History  Chief Complaint  Patient presents with   Drug Overdose    [name] is a 42 y.o. female past medical history significant for polysubstance use, long QT here for evaluation after overdose.  Patient arrived with EMS after being found "sleepy" at a gas station.  Patient admitted to using heroin.  Currently declined Narcan with EMS.  Patient states she recently moved in with her mother and she supposed to be sober.  States she started hanging back out with the "wrong crowd."  He denies any intent at self-harm.  No nausea or vomiting.  States she is a little "sleepy" Denies recent trauma or injuries.  HPI     Home Medications Prior to Admission medications   Medication Sig Start Date End Date Taking? Authorizing Provider  naloxone [facility]) nasal spray 4 mg/0.1 mL Use as prescribed 11/02/22  Yes Nychelle Cassata A, PA-C  cephALEXin (KEFLEX) 500 MG capsule Take 1 capsule (500 mg total) by mouth 4 (four) times daily. 09/23/22   Raspet, Noberto Retort, PA-C  DULoxetine (CYMBALTA) 20 MG capsule Take 20 mg by mouth daily.  10/27/19  [provider]  gabapentin (NEURONTIN) 300 MG capsule Take 1 capsule (300 mg total) by mouth 2 (two) times daily. 05/18/19 10/27/19  Elvina Sidle, MD      Allergies    Iodinated contrast media, Clindamycin/lincomycin, Other, and Sulfa antibiotics    Review of Systems   Review of Systems  Constitutional: Negative.   HENT: Negative.    Respiratory: Negative.    Cardiovascular: Negative.   Gastrointestinal: Negative.   Genitourinary: Negative.   Musculoskeletal: Negative.   Skin: Negative.   Neurological: Negative.   All other systems reviewed and are negative.   Physical Exam Updated Vital Signs BP 96/73 (BP Location: Right Arm)   Pulse 78   Temp (!) 97.5 F (36.4 C) (Oral)   Resp 16   Ht  (1.499  m)   Wt 49.9 kg   LMP  (LMP Unknown)   SpO2 99%   BMI 22.22 kg/m  Physical Exam Vitals and nursing note reviewed.  Constitutional:      General: She is not in acute distress.    Appearance: She is well-developed. She is not ill-appearing, toxic-appearing or diaphoretic.     Comments: Sleepy, arouses to voice  HENT:     Head: Atraumatic.  Eyes:     Pupils: Pupils are equal, round, and reactive to light.  Cardiovascular:     Rate and Rhythm: Normal rate.  Pulmonary:     Effort: No respiratory distress.  Abdominal:     General: There is no distension.  Musculoskeletal:        General: Normal range of motion.     Cervical back: Normal range of motion.  Skin:    General: Skin is warm and dry.     Capillary Refill: Capillary refill takes less than 2 seconds.     Comments: Track marks to bilateral upper extremities no overlying erythema, drainage  Neurological:     General: No focal deficit present.     Mental Status: She is oriented to person, place, and time.  Psychiatric:        Mood and Affect: Mood normal.     ED Results / Procedures / Treatments   Labs (all labs ordered are listed, but only abnormal  results are displayed) Labs Reviewed  CBC WITH DIFFERENTIAL/PLATELET - Abnormal; Notable for the following components:      Result Value   Monocytes Absolute 1.1 (*)    All other components within normal limits  BASIC METABOLIC PANEL - Abnormal; Notable for the following components:   Glucose, Bld 121 (*)    Calcium 8.5 (*)    All other components within normal limits  RAPID URINE DRUG SCREEN, HOSP PERFORMED - Abnormal; Notable for the following components:   Opiates POSITIVE (*)    Cocaine POSITIVE (*)    Amphetamines POSITIVE (*)    All other components within normal limits  ETHANOL  PREGNANCY, URINE    EKG None  Radiology No results found.  Procedures Procedures    Medications Ordered in ED Medications  sodium chloride 0.9 % bolus 1,000 mL (0 mLs  Intravenous Stopped 11/02/22 0945)   ED Course/ Medical Decision Making/ A&P   42 year old history of polysubstance use here for evaluation after being found "sleepy" at a gas station.  Admitted to using heroin.  She apparently declined Narcan with EMS.  On arrival she is alert, follows commands however does appear sleepy.  She declines Narcan.  She has no respiratory distress.  She states she needs to "sleep a little."  She appears otherwise well.  Her heart and lungs are clear.  Her abdomen is soft.  She does have track marks to bilateral upper extremities however does not appear to have active cellulitis or abscess.  Will plan on checking labs, provide IV fluids as she states that she has not had much to eat or drink over the last 24 hours and metabolize  Labs and imaging personally viewed and interpreted:  CBC without leukocytosis, hemoglobin 12.2 Pregnancy test negative Metabolic panel glucose 121 Ethanol negative UDS positive for opiates, cocaine, amphetamines  Patient reassessed.  Still sleepy.  Will out to continue to metabolize.  Patient reassessed.  Still sleepy.  Care transferred to oncoming provider who will follow-up on reassessment.  Anticipate DC home once patient more awake     Click here for ABCD2, HEART and other calculatorsREFRESH Note before signing :1}                          Medical Decision Making Amount and/or Complexity of Data Reviewed External Data Reviewed: labs and notes. Labs: ordered. Decision-making details documented in ED Course.  Risk OTC drugs. Prescription drug management. Parenteral controlled substances. Decision regarding hospitalization. Diagnosis or treatment significantly limited by social determinants of health.           Final Clinical Impression(s) / ED Diagnoses Final diagnoses:  Accidental overdose, initial encounter    Rx / DC Orders ED Discharge Orders          Ordered    naloxone [facility]) nasal spray 4 mg/0.1 mL         11/02/22 1337              Clare Fennimore A, PA-C 11/02/22 1524    Linwood Dibbles, MD 11/02/22 2016

## 2022-11-03 ENCOUNTER — Other Ambulatory Visit (HOSPITAL_COMMUNITY): Payer: Self-pay

## 2023-04-15 ENCOUNTER — Emergency Department (HOSPITAL_COMMUNITY)
Admission: EM | Admit: 2023-04-15 | Discharge: 2023-04-15 | Discharge status: Against Medical Advice | Payer: MEDICAID | Attending: Emergency Medicine | Admitting: Emergency Medicine

## 2023-04-15 ENCOUNTER — Encounter (HOSPITAL_COMMUNITY): Payer: Self-pay

## 2023-04-15 ENCOUNTER — Other Ambulatory Visit: Payer: Self-pay

## 2023-04-15 DIAGNOSIS — Z5321 Procedure and treatment not carried out due to patient leaving prior to being seen by health care provider: Secondary | ICD-10-CM | POA: Insufficient documentation

## 2023-04-15 DIAGNOSIS — Z760 Encounter for issue of repeat prescription: Secondary | ICD-10-CM | POA: Diagnosis present

## 2023-04-15 NOTE — ED Triage Notes (Signed)
Pt came in via POV d/t requesting help getting back on Subutex. She reports going to jail & she was taken off Methadone & while in jail being given Subutex. Once released she went to get her 7 day subscription filled until she can get back into a clinic & insurance purposes she was having a hard time getting it filled. She went back into jail & was given Subutex again & then released again last night. Pt is requesting a "cushion" to help her get through today until Monday so she can find another clinic. States her last dose was Subutex 16 mg yesterday morning. A/Ox4, denies any pain.

## 2023-12-31 IMAGING — CT CT HEAD W/O CM
3 of 4 series · 14 of 47 positions shown, 16 images · non-contrast
Comparison: CT April 14, 2004

CLINICAL DATA: Altered mental status, heroin withdrawal.



[Series 2: head wo · axial · 0.47mm/px · z∈[-650,-530]mm · 8 of 28 slices shown, 10 images]
[im 2/28  brain]
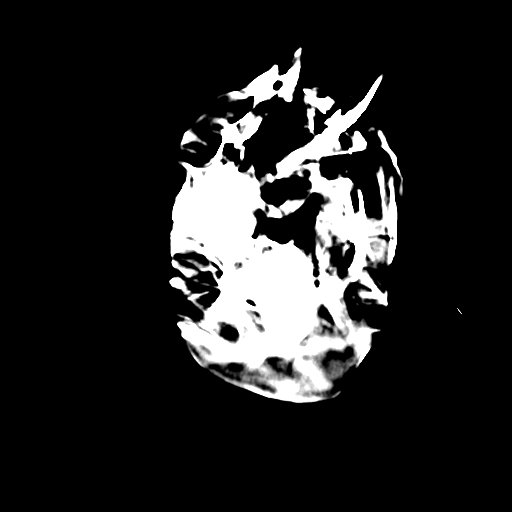
[im 2/28  bone]
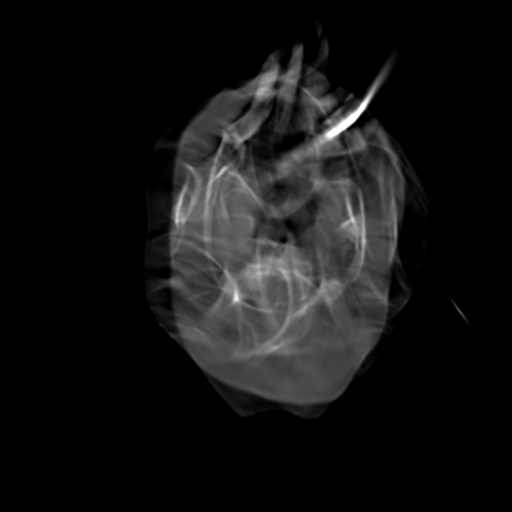
[im 6/28  brain]
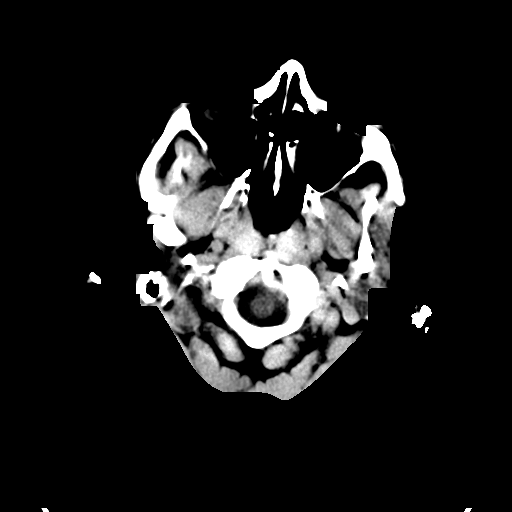
[im 10/28  brain]
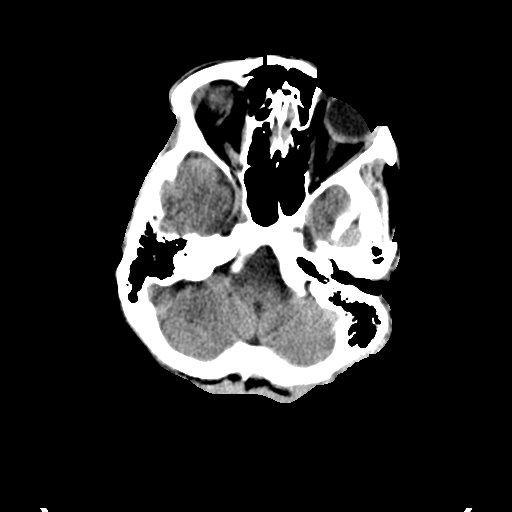
[im 12/28  brain]
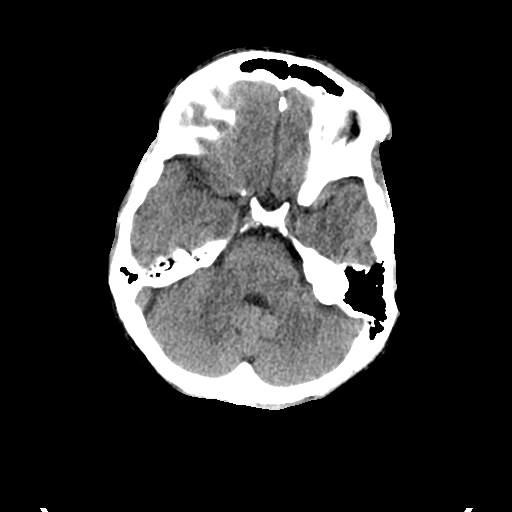
[im 16/28  brain]
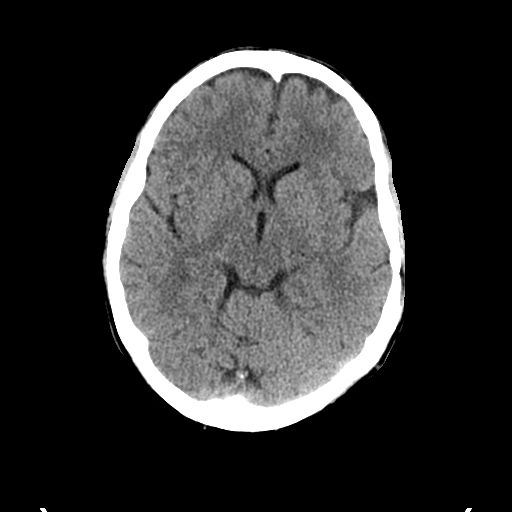
[im 16/28  bone]
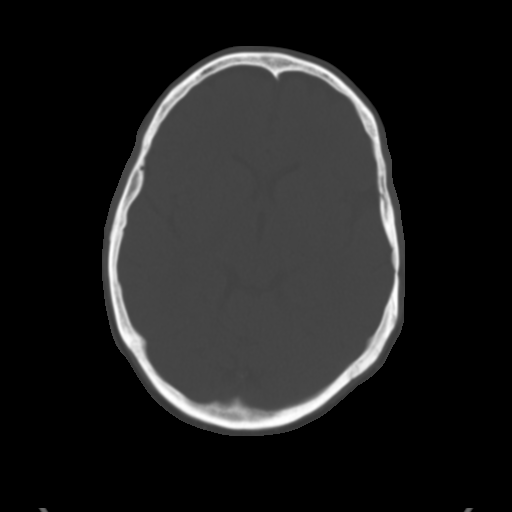
[im 18/28  brain]
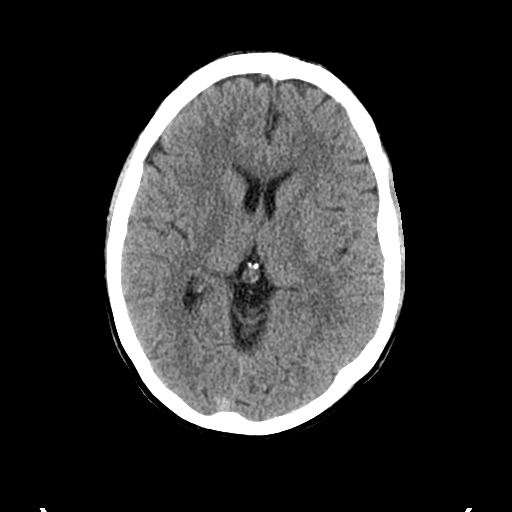
[im 22/28  brain]
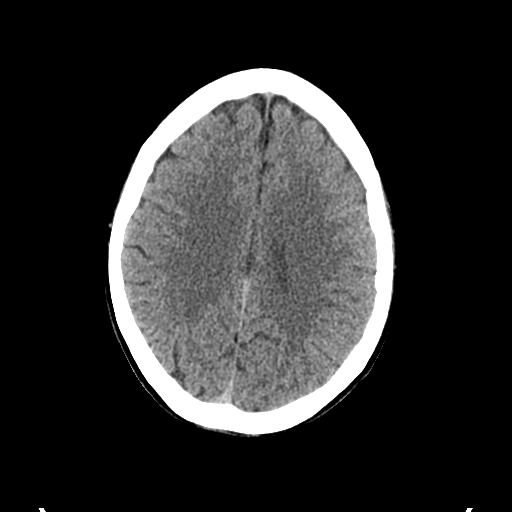
[im 26/28  brain]
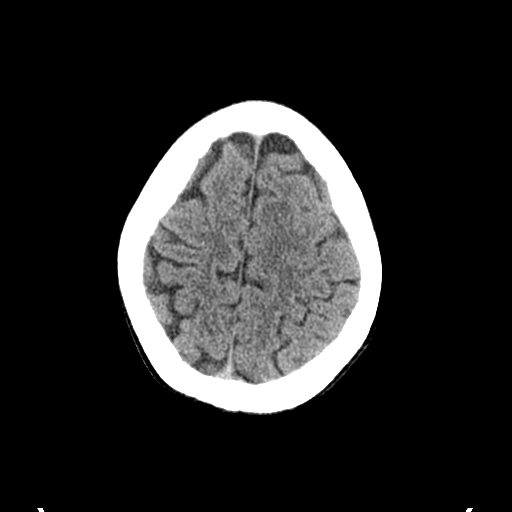

[Series 8: coronal soft tissue · coronal · 0.32mm/px · 3 of 62 slices shown]
[im 21/62  brain]
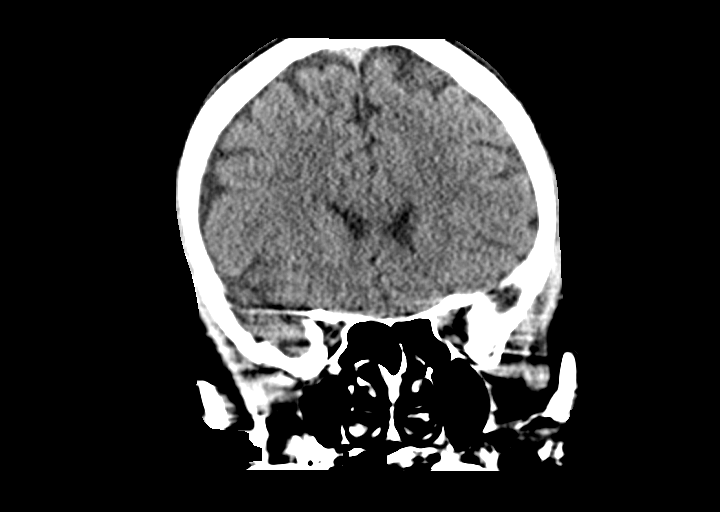
[im 28/62  brain]
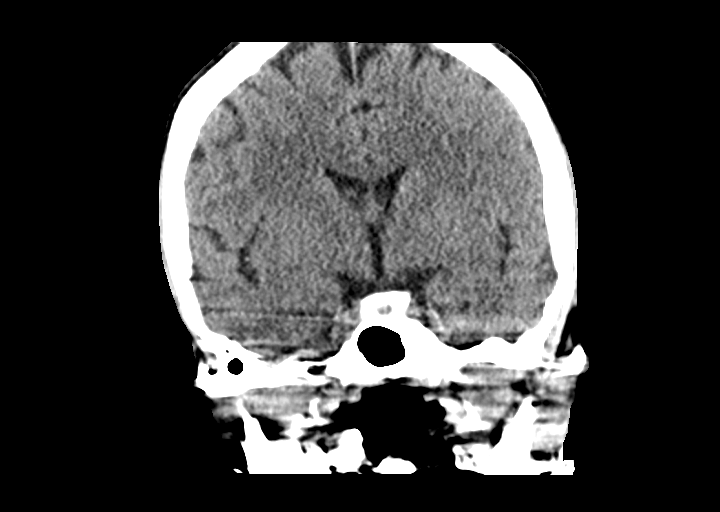
[im 34/62  brain]
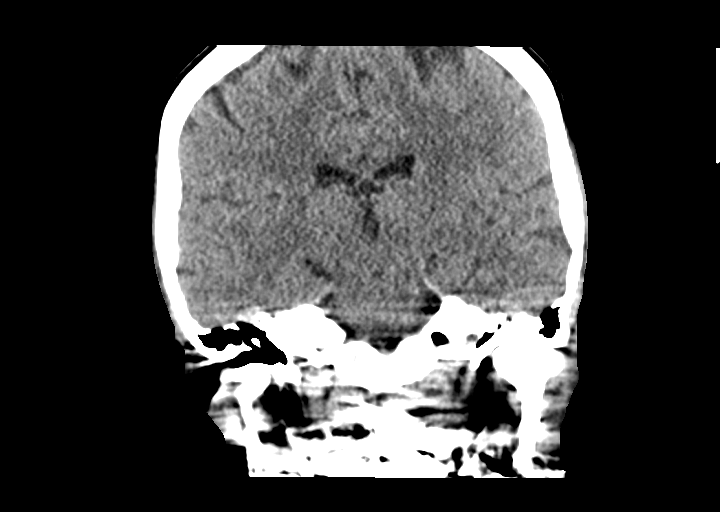

[Series 9: sagittal soft tissue · sagittal · 0.34mm/px · 3 of 47 slices shown]
[im 16/47  brain]
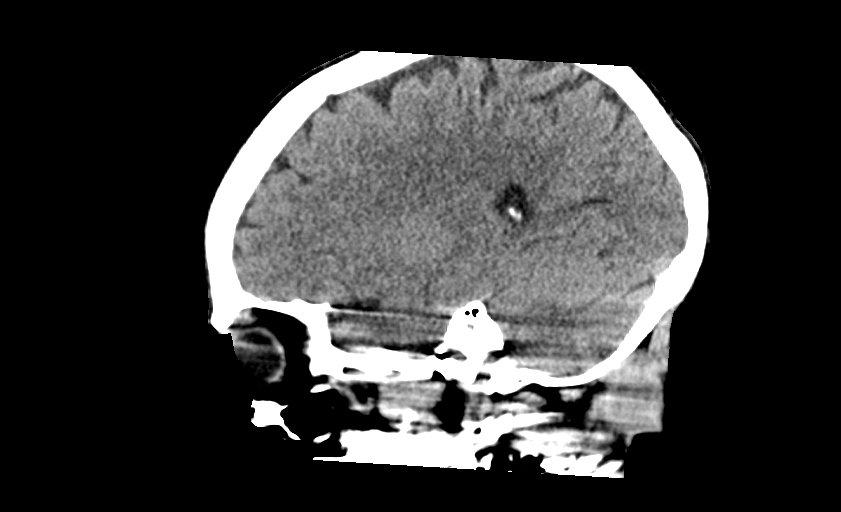
[im 24/47  brain]
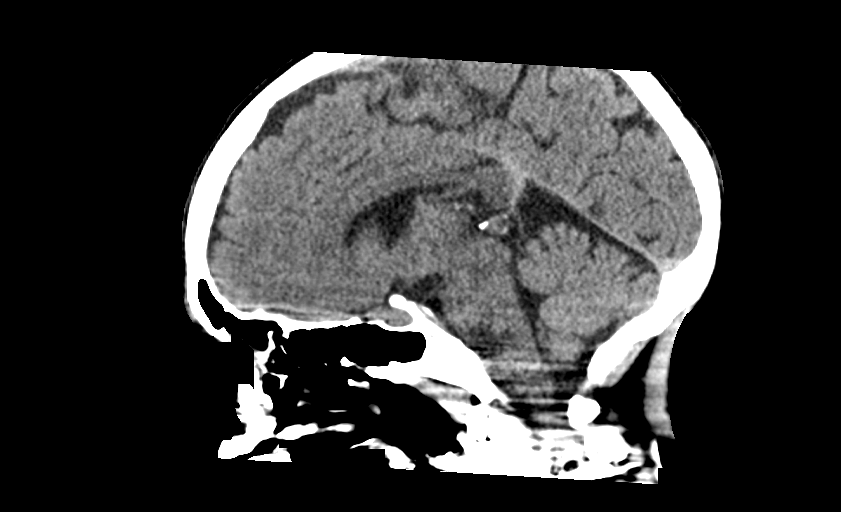
[im 31/47  brain]
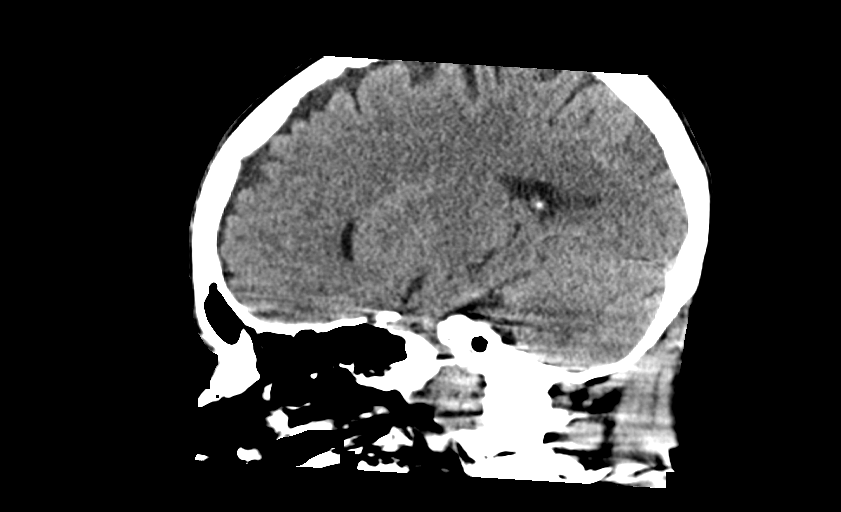

[14 of 47 positions shown; findings below may reference images not displayed]

FINDINGS: Slightly motion degraded examination.

Brain: No evidence of acute infarction, hemorrhage, hydrocephalus,
extra-axial collection or mass lesion/mass effect.

Vascular: No hyperdense vessel or unexpected calcification.

Skull: Normal. Negative for fracture or focal lesion.

Sinuses/Orbits: Visualized portions of the paranasal sinuses are
predominantly clear. Orbits are unremarkable.

Other: Mastoid air cells are predominantly clear.
IMPRESSION: No acute intracranial abnormality on this slightly motion degraded
examination.
